# Patient Record
Sex: Male | Born: 1985 | Race: Black or African American | Hispanic: No | Marital: Single | Smoking: Former smoker
Health system: Southern US, Community
[De-identification: ages and names within clinical notes are randomized; demographics above are authoritative.]

## PROBLEM LIST (undated history)

## (undated) DIAGNOSIS — E0590 Thyrotoxicosis, unspecified without thyrotoxic crisis or storm: Secondary | ICD-10-CM

## (undated) DIAGNOSIS — A549 Gonococcal infection, unspecified: Secondary | ICD-10-CM

---

## 2004-07-13 ENCOUNTER — Encounter: Admission: RE | Admit: 2004-07-13 | Discharge: 2004-07-13 | Payer: Self-pay | Admitting: Internal Medicine

## 2007-12-09 ENCOUNTER — Emergency Department (HOSPITAL_COMMUNITY): Admission: EM | Admit: 2007-12-09 | Discharge: 2007-12-09 | Payer: Self-pay | Admitting: Emergency Medicine

## 2011-03-02 LAB — OCCULT BLOOD X 1 CARD TO LAB, STOOL: Fecal Occult Bld: NEGATIVE

## 2011-03-02 LAB — URINALYSIS, ROUTINE W REFLEX MICROSCOPIC
Bilirubin Urine: NEGATIVE
Hgb urine dipstick: NEGATIVE
Specific Gravity, Urine: 1.024
Urobilinogen, UA: 0.2
pH: 7

## 2011-03-02 LAB — DIFFERENTIAL
Basophils Absolute: 0.1
Basophils Relative: 2 — ABNORMAL HIGH
Eosinophils Absolute: 0
Eosinophils Relative: 1
Monocytes Absolute: 0.5

## 2011-03-02 LAB — COMPREHENSIVE METABOLIC PANEL
AST: 28
Albumin: 3.8
Alkaline Phosphatase: 78
CO2: 28
Chloride: 104
Creatinine, Ser: 1.16
GFR calc Af Amer: >60
GFR calc non Af Amer: >60
Potassium: 4.4
Total Bilirubin: 1.1

## 2011-03-02 LAB — CBC
HCT: 42.5
MCV: 88.4
Platelets: 178
RBC: 4.8
WBC: 4.4

## 2011-03-02 LAB — LIPASE, BLOOD: Lipase: 16

## 2019-10-02 ENCOUNTER — Ambulatory Visit: Payer: Self-pay | Attending: Family

## 2019-10-02 DIAGNOSIS — Z23 Encounter for immunization: Secondary | ICD-10-CM

## 2019-10-02 NOTE — Progress Notes (Signed)
   Covid-19 Vaccination Clinic  Name:  Mathew Wong    MRN: [id number] DOB: Oct 13, 1985  10/02/2019  [name] was observed post Covid-19 immunization for 15 minutes without incident. He was provided with Vaccine Information Sheet and instruction to access the V-Safe system.   [name] was instructed to call 911 with any severe reactions post vaccine: Marland Kitchen Difficulty breathing  . Swelling of face and throat  . A fast heartbeat  . A bad rash all over body  . Dizziness and weakness   Immunizations Administered    Name Date Dose VIS Date Route   Moderna COVID-19 Vaccine 10/02/2019  1:31 PM 0.5 mL 05/2019 Intramuscular   Manufacturer: Gala Murdoch   Lot: [id number]   NDC: 30865-784-69      Covid-19 Vaccination Clinic  Name:  Mathew Wong    MRN: [id number] DOB: Aug 11, 1985  10/02/2019  [name] was observed post Covid-19 immunization for 15 minutes without incident. He was provided with Vaccine Information Sheet and instruction to access the V-Safe system.   [name] was instructed to call 911 with any severe reactions post vaccine: Marland Kitchen Difficulty breathing  . Swelling of face and throat  . A fast heartbeat  . A bad rash all over body  . Dizziness and weakness   Immunizations Administered    Name Date Dose VIS Date Route   Moderna COVID-19 Vaccine 10/02/2019  1:31 PM 0.5 mL 05/2019 Intramuscular   Manufacturer: Moderna   Lot: [id number]   NDC: 72536-644-03

## 2019-10-28 ENCOUNTER — Ambulatory Visit: Payer: Self-pay | Attending: Family

## 2019-10-28 DIAGNOSIS — Z23 Encounter for immunization: Secondary | ICD-10-CM

## 2019-10-28 NOTE — Progress Notes (Signed)
   Covid-19 Vaccination Clinic  Name:  Mathew Wong    MRN: [id number] DOB: 1986/05/17  10/28/2019  [name] was observed post Covid-19 immunization for 15 minutes without incident. He was provided with Vaccine Information Sheet and instruction to access the V-Safe system.   [name] was instructed to call 911 with any severe reactions post vaccine: Marland Kitchen Difficulty breathing  . Swelling of face and throat  . A fast heartbeat  . A bad rash all over body  . Dizziness and weakness   Immunizations Administered    Name Date Dose VIS Date Route   Moderna COVID-19 Vaccine 10/28/2019  1:17 PM 0.5 mL 05/2019 Intramuscular   Manufacturer: Moderna   Lot: [id number]   NDC: 24299-806-99

## 2020-06-11 ENCOUNTER — Other Ambulatory Visit: Payer: Self-pay

## 2020-06-11 ENCOUNTER — Encounter (HOSPITAL_COMMUNITY): Payer: Self-pay

## 2020-06-11 DIAGNOSIS — Z5321 Procedure and treatment not carried out due to patient leaving prior to being seen by health care provider: Secondary | ICD-10-CM | POA: Insufficient documentation

## 2020-06-11 DIAGNOSIS — K644 Residual hemorrhoidal skin tags: Secondary | ICD-10-CM | POA: Insufficient documentation

## 2020-06-11 NOTE — ED Triage Notes (Signed)
Pt reports external hemorrhoids off an on for a month.

## 2020-06-12 ENCOUNTER — Emergency Department (HOSPITAL_COMMUNITY)
Admission: EM | Admit: 2020-06-12 | Discharge: 2020-06-12 | Disposition: A | Discharge status: Home / Self Care | Payer: Self-pay | Attending: Emergency Medicine | Admitting: Emergency Medicine

## 2020-06-12 NOTE — ED Notes (Signed)
Pt called 3x for room placement. Eloped from waiting area.  

## 2020-09-10 ENCOUNTER — Other Ambulatory Visit: Payer: Self-pay

## 2020-09-10 ENCOUNTER — Encounter (HOSPITAL_COMMUNITY): Payer: Self-pay | Admitting: Emergency Medicine

## 2020-09-10 ENCOUNTER — Emergency Department (HOSPITAL_COMMUNITY)
Admission: EM | Admit: 2020-09-10 | Discharge: 2020-09-10 | Disposition: A | Discharge status: Home / Self Care | Payer: Self-pay | Attending: Emergency Medicine | Admitting: Emergency Medicine

## 2020-09-10 DIAGNOSIS — Z202 Contact with and (suspected) exposure to infections with a predominantly sexual mode of transmission: Secondary | ICD-10-CM | POA: Insufficient documentation

## 2020-09-10 DIAGNOSIS — R309 Painful micturition, unspecified: Secondary | ICD-10-CM | POA: Insufficient documentation

## 2020-09-10 DIAGNOSIS — Z711 Person with feared health complaint in whom no diagnosis is made: Secondary | ICD-10-CM

## 2020-09-10 DIAGNOSIS — R369 Urethral discharge, unspecified: Secondary | ICD-10-CM | POA: Insufficient documentation

## 2020-09-10 DIAGNOSIS — R300 Dysuria: Secondary | ICD-10-CM

## 2020-09-10 HISTORY — DX: Gonococcal infection, unspecified: A54.9

## 2020-09-10 LAB — URINALYSIS, ROUTINE W REFLEX MICROSCOPIC
Bilirubin Urine: NEGATIVE
Glucose, UA: NEGATIVE mg/dL
Ketones, ur: NEGATIVE mg/dL
Leukocytes,Ua: NEGATIVE
Nitrite: NEGATIVE
Protein, ur: NEGATIVE mg/dL
Specific Gravity, Urine: 1.019 (ref 1.005–1.030)
pH: 5 (ref 5.0–8.0)

## 2020-09-10 LAB — HIV ANTIBODY (ROUTINE TESTING W REFLEX): HIV Screen 4th Generation wRfx: NONREACTIVE

## 2020-09-10 MED ORDER — DOXYCYCLINE HYCLATE 100 MG PO CAPS: 100.0000 mg | ORAL_CAPSULE | Freq: Two times a day (BID) | ORAL | 0 refills | Status: AC

## 2020-09-10 MED ORDER — STERILE WATER FOR INJECTION IJ SOLN
INTRAMUSCULAR | Status: AC
  Filled 2020-09-10: qty 10

## 2020-09-10 MED ORDER — DOXYCYCLINE HYCLATE 100 MG PO TABS
100.0000 mg | ORAL_TABLET | Freq: Once | ORAL | Status: AC
  Administered 2020-09-10: 100 mg via ORAL
  Filled 2020-09-10: qty 1

## 2020-09-10 MED ORDER — CEFTRIAXONE SODIUM 1 G IJ SOLR
500.0000 mg | Freq: Once | INTRAMUSCULAR | Status: AC
  Administered 2020-09-10: 500 mg via INTRAMUSCULAR
  Filled 2020-09-10: qty 10

## 2020-09-10 NOTE — ED Triage Notes (Signed)
Patient reports penile discomfort for the past couple of days that has gotten worse and more constant. Had intercourse recently but used a condom. Denies any open sores or lesions, denies swelling, discharge or concern for HIV or syphilis. Would like to be STD tested.

## 2020-09-10 NOTE — ED Notes (Signed)
Pt discharged from this ED in stable condition at this time. All discharge instructions and follow up care reviewed with pt with no further questions at this time. Pt ambulatory with steady gait, clear speech.  

## 2020-09-10 NOTE — ED Provider Notes (Signed)
[facility] Provider Note   CSN: [id number] Arrival date & time: 09/10/20  1006     History Std check   Mathew Wong is a 35 y.o. male with history significant for gonorrhea who presents for evaluation of penile discomfort.  States he has had some burning with urination.  He denies any rashes or lesions to his penis.  No testicular pain.  No recent sores, lesions, swelling, discharge.  He is sexually active.  Recently had intercourse and used protection.  No fever, chills, nausea, vomiting, chest pain, shortness of breath, testicular pain, pain, rectal bleeding, redness, swelling, warmth to GU region.  No hematuria, flank pain or abdominal pain.  Denies additional aggravating or alleviating factors.  He likely tested for STDs. No pain with BM.  History obtained from patient and past medical records.  No interpreter is used.  HPI     Past Medical History:  Diagnosis Date  . Gonorrhea     There are no problems to display for this patient.   No past surgical history on file.     No family history on file.  Social History   Tobacco Use  . Smoking status: Never Smoker  . Smokeless tobacco: Never Used  Substance Use Topics  . Alcohol use: Not Currently  . Drug use: Not Currently    Home Medications Prior to Admission medications   Medication Sig Start Date End Date Taking? Authorizing Provider  doxycycline (VIBRAMYCIN) 100 MG capsule Take 1 capsule (100 mg total) by mouth 2 (two) times daily for 7 days. 09/10/20 09/17/20 Yes Renlee Floor A, PA-C    Allergies    Patient has no known allergies.  Review of Systems   Review of Systems  Constitutional: Negative.   HENT: Negative.   Respiratory: Negative.   Cardiovascular: Negative.   Gastrointestinal: Negative.   Genitourinary: Negative for decreased urine volume, difficulty urinating, flank pain, frequency, genital sores, hematuria, penile discharge, penile pain, penile  swelling, scrotal swelling, testicular pain and urgency.       Burning with urination  Musculoskeletal: Negative.   Skin: Negative.   Neurological: Negative.   All other systems reviewed and are negative.   Physical Exam Updated Vital Signs BP (!) 151/81   Pulse (!) 101   Temp 98.6 F (37 C) (Oral)   Resp 18   Ht 5\' 8"  (1.727 m)   Wt 73 kg   SpO2 100%   BMI 24.47 kg/m   Physical Exam Vitals and nursing note reviewed. Exam conducted with a chaperone present.  Constitutional:      General: He is not in acute distress.    Appearance: He is well-developed. He is not ill-appearing, toxic-appearing or diaphoretic.  HENT:     Head: Normocephalic and atraumatic.     Nose: Nose normal.     Mouth/Throat:     Mouth: Mucous membranes are moist.  Eyes:     Pupils: Pupils are equal, round, and reactive to light.  Cardiovascular:     Rate and Rhythm: Normal rate and regular rhythm.     Pulses: Normal pulses.     Heart sounds: Normal heart sounds.  Pulmonary:     Effort: Pulmonary effort is normal. No respiratory distress.     Breath sounds: Normal breath sounds.  Abdominal:     General: Bowel sounds are normal. There is no distension.     Palpations: Abdomen is soft.     Tenderness: There is no abdominal tenderness. There  is no right CVA tenderness, left CVA tenderness or guarding.  Genitourinary:    Pubic Area: No rash or pubic lice.      Penis: Normal. No phimosis, paraphimosis, hypospadias, erythema, tenderness, discharge, swelling or lesions.      Testes: Normal. Cremasteric reflex is present.     Epididymis:     Right: Normal.     Left: Normal.     Comments: [place] RN in room as chaperone. No rashes or lesions.  Cremasteric reflex present.  No tenderness over bilateral epididymis.  No inguinal lymphadenopathy or gross hernias Musculoskeletal:        General: Normal range of motion.     Cervical back: Normal range of motion and neck supple.  Skin:    General: Skin is  warm and dry.  Neurological:     Mental Status: He is alert.     ED Results / Procedures / Treatments   Labs (all labs ordered are listed, but only abnormal results are displayed) Labs Reviewed  URINALYSIS, ROUTINE W REFLEX MICROSCOPIC - Abnormal; Notable for the following components:      Result Value   Hgb urine dipstick SMALL (*)    Bacteria, UA RARE (*)    All other components within normal limits  RPR  HIV ANTIBODY (ROUTINE TESTING W REFLEX)  GC/CHLAMYDIA PROBE AMP ([place]) NOT AT [facility]    EKG None  Radiology No results found.  Procedures Procedures   Medications Ordered in ED Medications  sterile water (preservative free) injection (has no administration in time range)  cefTRIAXone (ROCEPHIN) injection 500 mg (500 mg Intramuscular Given 09/10/20 1107)  doxycycline (VIBRA-TABS) tablet 100 mg (100 mg Oral Given 09/10/20 1107)    ED Course  I have reviewed the triage vital signs and the nursing notes.  Pertinent labs & imaging results that were available during my care of the patient were reviewed by me and considered in my medical decision making (see chart for details).   Patient here for evaluation requesting STD testing.  Has had some intermittent burning with urination over the last few days.  No rashes or lesions.  Sexually active, last use a condom.  Cremasteric reflex present.  Nontender bilateral testes and lower epididymis.  No inguinal lymphadenopathy or gross hernias. No pain with BM. Will obtain STD testing.  Patient is afebrile without abdominal tenderness, abdominal pain or painful bowel movements to indicate prostatitis.  No tenderness to palpation of the testes or epididymis to suggest orchitis or epididymitis.  No testicular pain to suggest torsion.  No flank pain to suggest renal stones.  STD cultures obtained including HIV, syphilis, gonorrhea and chlamydia. Patient to be discharged with instructions to follow up with PCP. Discussed importance of  using protection when sexually active. Pt understands that they have GC/Chlamydia cultures pending and that they will need to inform all sexual partners if results return positive. Patient has been treated prophylactically with Doxy and Rocephin.   UA with bacteria, nitrite and leuk negative GC/ Chlamydia, RPR, HIV pending at dc  The patient has been appropriately medically screened and/or stabilized in the ED. I have low suspicion for any other emergent medical condition which would require further screening, evaluation or treatment in the ED or require inpatient management.  Patient is hemodynamically stable and in no acute distress.  Patient able to ambulate in department prior to ED.  Evaluation does not show acute pathology that would require ongoing or additional emergent interventions while in the emergency department or  further inpatient treatment.  I have discussed the diagnosis with the patient and answered all questions.  Pain is been managed while in the emergency department and patient has no further complaints prior to discharge.  Patient is comfortable with plan discussed in room and is stable for discharge at this time.  I have discussed strict return precautions for returning to the emergency department.  Patient was encouraged to follow-up with PCP/specialist refer to at discharge.    MDM Rules/Calculators/A&P                           Final Clinical Impression(s) / ED Diagnoses Final diagnoses:  Burning with urination  Concern about STD in male without diagnosis    Rx / DC Orders ED Discharge Orders         Ordered    doxycycline (VIBRAMYCIN) 100 MG capsule  2 times daily        09/10/20 1123           Mariaclara Spear A, PA-C 09/10/20 1128    Pricilla Loveless, MD 09/10/20 1828

## 2020-09-10 NOTE — Discharge Instructions (Signed)
We obtained blood work as well as STD testing for here in the emergency department.  This takes approximately 2 days to return.  If anything is positive you will be called and notified.  At that time will need to notify your partners.  Given your symptoms to be give you a shot of antibiotics as well as sending you home with antibiotics here for possible STDs.  Return for new or worsening symptoms

## 2020-09-11 LAB — RPR: RPR Ser Ql: NONREACTIVE

## 2020-09-13 LAB — GC/CHLAMYDIA PROBE AMP (~~LOC~~) NOT AT ARMC
Chlamydia: NEGATIVE
Comment: NEGATIVE
Comment: NORMAL
Neisseria Gonorrhea: NEGATIVE

## 2020-10-28 ENCOUNTER — Encounter (HOSPITAL_COMMUNITY): Payer: Self-pay | Admitting: *Deleted

## 2020-10-28 ENCOUNTER — Emergency Department (HOSPITAL_COMMUNITY)
Admission: EM | Admit: 2020-10-28 | Discharge: 2020-10-28 | Disposition: A | Discharge status: Home / Self Care | Payer: Self-pay | Attending: Emergency Medicine | Admitting: Emergency Medicine

## 2020-10-28 DIAGNOSIS — E039 Hypothyroidism, unspecified: Secondary | ICD-10-CM | POA: Insufficient documentation

## 2020-10-28 DIAGNOSIS — R7989 Other specified abnormal findings of blood chemistry: Secondary | ICD-10-CM

## 2020-10-28 LAB — COMPREHENSIVE METABOLIC PANEL
ALT: 36 U/L (ref 0–44)
AST: 22 U/L (ref 15–41)
Albumin: 3.5 g/dL (ref 3.5–5.0)
Alkaline Phosphatase: 152 U/L — ABNORMAL HIGH (ref 38–126)
Anion gap: 7 (ref 5–15)
BUN: 19 mg/dL (ref 6–20)
CO2: 24 mmol/L (ref 22–32)
Calcium: 9.4 mg/dL (ref 8.9–10.3)
Chloride: 105 mmol/L (ref 98–111)
Creatinine, Ser: 0.87 mg/dL (ref 0.61–1.24)
GFR, Estimated: >60 mL/min (ref >60)
Glucose, Bld: 112 mg/dL — ABNORMAL HIGH (ref 70–99)
Potassium: 4.2 mmol/L (ref 3.5–5.1)
Sodium: 136 mmol/L (ref 135–145)
Total Bilirubin: 0.5 mg/dL (ref 0.3–1.2)
Total Protein: 6.7 g/dL (ref 6.5–8.1)

## 2020-10-28 LAB — CBC WITH DIFFERENTIAL/PLATELET
Abs Immature Granulocytes: 0.01 10*3/uL (ref 0.00–0.07)
Basophils Absolute: 0 10*3/uL (ref 0.0–0.1)
Basophils Relative: 1 %
Eosinophils Absolute: 0.2 10*3/uL (ref 0.0–0.5)
Eosinophils Relative: 5 %
HCT: 45.2 % (ref 39.0–52.0)
Hemoglobin: 14.6 g/dL (ref 13.0–17.0)
Immature Granulocytes: 0 %
Lymphocytes Relative: 37 %
Lymphs Abs: 1.3 10*3/uL (ref 0.7–4.0)
MCH: 27 pg (ref 26.0–34.0)
MCHC: 32.3 g/dL (ref 30.0–36.0)
MCV: 83.5 fL (ref 80.0–100.0)
Monocytes Absolute: 0.6 10*3/uL (ref 0.1–1.0)
Monocytes Relative: 17 %
Neutro Abs: 1.5 10*3/uL — ABNORMAL LOW (ref 1.7–7.7)
Neutrophils Relative %: 40 %
Platelets: 224 10*3/uL (ref 150–400)
RBC: 5.41 MIL/uL (ref 4.22–5.81)
RDW: 14.1 % (ref 11.5–15.5)
WBC: 3.6 10*3/uL — ABNORMAL LOW (ref 4.0–10.5)
nRBC: 0 % (ref 0.0–0.2)

## 2020-10-28 LAB — T4, FREE: Free T4: 2.55 ng/dL — ABNORMAL HIGH (ref 0.61–1.12)

## 2020-10-28 LAB — TSH: TSH: <0.01 u[IU]/mL — ABNORMAL LOW (ref 0.350–4.500)

## 2020-10-28 NOTE — ED Triage Notes (Signed)
Pt states his eyes have been draining more than usual and he has lost come weight. He is concerned about his thyroid.

## 2020-10-28 NOTE — Discharge Instructions (Addendum)
As we discussed, your thyroid-stimulating hormone was slightly low here today.  This can sometimes mean that you have hyperthyroidism.  As we discussed, this is something that needs to be addressed by primary care doctor.  I have provided you referral to [facility] wellness clinic. I have also tried a consult to our Transition Care team.  You can also call to primary care clinics in the area to see if they can arrange for an appointment.  This needs to be followed up in the next few weeks.  Rest of your lab work was reassuring.  As we discussed, you had a slightly low white blood cell count but otherwise looked good.  Return to the emergency department if you have any difficulty breathing, vomiting, feeling like your heart is racing or any other worsening concerning symptoms.

## 2020-10-28 NOTE — ED Provider Notes (Signed)
[facility] Note   CSN: [id number] Arrival date & time: 10/28/20  2800     History Chief Complaint  Patient presents with  . Eye Problem    Mathew Wong is a 35 y.o. male who presents for evaluation of concerns of drainage from his eyes.  He states for the last several months, he has noted watery drainage from both eyes.  Sometimes his eyes will be red.  No eye pain, vision changes.  He also reports that since December he has lost about 15 pounds.  He has not followed up with anybody about this.  He states he came in today because he is concerned about his thyroid.  He states no history of thyroid issues but states that has a history of thyroid issues.  He has not had any fevers, chest pain, difficulty breathing, abdominal pain, nausea/vomiting.  No night sweats, fevers.  The history is provided by the patient.       Past Medical History:  Diagnosis Date  . Gonorrhea     There are no problems to display for this patient.   History reviewed. No pertinent surgical history.     No family history on file.  Social History   Tobacco Use  . Smoking status: Never Smoker  . Smokeless tobacco: Never Used  Substance Use Topics  . Alcohol use: Not Currently  . Drug use: Not Currently    Home Medications Prior to Admission medications   Not on File    Allergies    Patient has no known allergies.  Review of Systems   Review of Systems  Constitutional: Positive for unexpected weight change. Negative for diaphoresis and fever.  Eyes: Positive for discharge and redness. Negative for photophobia and visual disturbance.  Respiratory: Negative for cough and shortness of breath.   Cardiovascular: Negative for chest pain.  Gastrointestinal: Negative for abdominal pain, nausea and vomiting.  Genitourinary: Negative for dysuria and hematuria.  Neurological: Negative for headaches.  All other systems reviewed and are  negative.   Physical Exam Updated Vital Signs BP (!) 142/88   Pulse 81   Temp 98.4 F (36.9 C) (Oral)   Resp 18   SpO2 96%   Physical Exam Vitals and nursing note reviewed.  Constitutional:      Appearance: Normal appearance. He is well-developed.  HENT:     Head: Normocephalic and atraumatic.  Eyes:     General: Lids are normal.     Conjunctiva/sclera: Conjunctivae normal.     Pupils: Pupils are equal, round, and reactive to light.     Comments: PERRL. EOMs intact. No nystagmus. No neglect.  No conjunctival injection bilaterally.  No active drainage.  Neck:     Thyroid: No thyroid mass, thyromegaly or thyroid tenderness.     Comments: No thyromegaly noted with palpation of anterior neck. Cardiovascular:     Rate and Rhythm: Normal rate and regular rhythm.     Pulses: Normal pulses.     Heart sounds: Normal heart sounds. No murmur heard. No friction rub. No gallop.   Pulmonary:     Effort: Pulmonary effort is normal.     Breath sounds: Normal breath sounds.     Comments: Lungs clear to auscultation bilaterally.  Symmetric chest rise.  No wheezing, rales, rhonchi. Abdominal:     Palpations: Abdomen is soft. Abdomen is not rigid.     Tenderness: There is no abdominal tenderness. There is no guarding.     Comments:  Abdomen is soft, non-distended, non-tender. No rigidity, No guarding. No peritoneal signs.  Musculoskeletal:        General: Normal range of motion.     Cervical back: Full passive range of motion without pain.  Skin:    General: Skin is warm and dry.     Capillary Refill: Capillary refill takes less than 2 seconds.  Neurological:     Mental Status: He is alert and oriented to person, place, and time.  Psychiatric:        Speech: Speech normal.     ED Results / Procedures / Treatments   Labs (all labs ordered are listed, but only abnormal results are displayed) Labs Reviewed  COMPREHENSIVE METABOLIC PANEL - Abnormal; Notable for the following  components:      Result Value   Glucose, Bld 112 (*)    Alkaline Phosphatase 152 (*)    All other components within normal limits  CBC WITH DIFFERENTIAL/PLATELET - Abnormal; Notable for the following components:   WBC 3.6 (*)    Neutro Abs 1.5 (*)    All other components within normal limits  TSH - Abnormal; Notable for the following components:   TSH <0.010 (*)    All other components within normal limits  T4, FREE    EKG None  Radiology No results found.  Procedures Procedures   Medications Ordered in ED Medications - No data to display  ED Course  I have reviewed the triage vital signs and the nursing notes.  Pertinent labs & imaging results that were available during my care of the patient were reviewed by me and considered in my medical decision making (see chart for details).    MDM Rules/Calculators/A&P                          35 year old male who presents for evaluation of wanting his thyroid checked.  Reports he has had some watery drainage from both eyes over the last several months.  Also reports he has lost about 15 pounds since December 2021.  Reports dad has a history of thyroid issues.  He has never gotten evaluated.  He does not have a primary care doctor that he follows up with.  He denies any chest pain, difficulty breathing, fevers, night sweats.  On initial arrival, he is well-appearing, vital stable.  No acute symptoms at this time.  We will plan to check labs.   CBC shows leukopenia of 3.6.  CMP shows normal BUN/Cr. Glucose is 112.  TSH is low.  Free T4 sent.  I discussed results with patient.  At this time, clinically is well-appearing.  He shows no signs of distress.  No clinical indication of thyroid storm.  I discussed with him that at this time, starting him on a thyroid medication without proper follow-up, signs of acute distress would not be in his best interest.  I stressed the importance of establishing with a primary care doctor.  I provided  him code wellness referral as well as transition of care consult to see if we can help him obtain a PCP.  He is aware that he needs to get this reevaluated soon as possible. Patient had ample opportunity for questions and discussion. All patient's questions were answered with full understanding. Strict return precautions discussed. Patient expresses understanding and agreement to plan.   Portions of this note were generated with Scientist, clinical (histocompatibility and immunogenetics). Dictation errors may occur despite best attempts at proofreading.  Final  Clinical Impression(s) / ED Diagnoses Final diagnoses:  Low TSH level    Rx / DC Orders ED Discharge Orders    None       Maxwell Caul, PA-C 10/28/20 1434    Mancel Bale, MD 10/29/20 [phone]

## 2020-12-08 ENCOUNTER — Other Ambulatory Visit: Payer: Self-pay

## 2020-12-08 ENCOUNTER — Ambulatory Visit: Payer: Self-pay | Attending: Physician Assistant | Admitting: Physician Assistant

## 2020-12-08 ENCOUNTER — Encounter: Payer: Self-pay | Admitting: Physician Assistant

## 2020-12-08 DIAGNOSIS — R634 Abnormal weight loss: Secondary | ICD-10-CM

## 2020-12-08 DIAGNOSIS — R7989 Other specified abnormal findings of blood chemistry: Secondary | ICD-10-CM

## 2020-12-08 DIAGNOSIS — Z09 Encounter for follow-up examination after completed treatment for conditions other than malignant neoplasm: Secondary | ICD-10-CM

## 2020-12-08 NOTE — Progress Notes (Signed)
Patient ID: Mathew Wong, male   DOB: 1986-04-22, 35 y.o.   MRN: [id number] Virtual Visit via Telephone Note  I connected with Mathew Wong on 12/08/20 at  9:10 AM EDT by telephone and verified that I am speaking with the correct person using two identifiers.  Location: Patient: home Provider: [facility] office   I discussed the limitations, risks, security and privacy concerns of performing an evaluation and management service by telephone and the availability of in person appointments. I also discussed with the patient that there may be a patient responsible charge related to this service. The patient expressed understanding and agreed to proceed.   History of Present Illness: After ED visit 10/28/2020 for weight loss, palpitations, watery eyes and wanting to get his thyroid checked.  His dad and maternal gm have/had thyroid dz.  He was found to have abnormal TSH and T4-not started on any meds.  He does not have insurance.    From ED note:  35 year old male who presents for evaluation of wanting his thyroid checked.  Reports he has had some watery drainage from both eyes over the last several months.  Also reports he has lost about 15 pounds since December 2021.  Reports dad has a history of thyroid issues.  He has never gotten evaluated.  He does not have a primary care doctor that he follows up with.  He denies any chest pain, difficulty breathing, fevers, night sweats.  On initial arrival, he is well-appearing, vital stable.  No acute symptoms at this time.  We will plan to check labs.   CBC shows leukopenia of 3.6.  CMP shows normal BUN/Cr. Glucose is 112.  TSH is low.  Free T4 sent.   I discussed results with patient.  At this time, clinically is well-appearing.  He shows no signs of distress.  No clinical indication of thyroid storm.  I discussed with him that at this time, starting him on a thyroid medication without proper follow-up, signs of acute distress would not be in his best  interest.  I stressed the importance of establishing with a primary care doctor.  I provided him code wellness referral as well as transition of care consult to see if we can help him obtain a PCP.  He is aware that he needs to get this reevaluated soon as possible. Patient had ample opportunity for questions and discussion. All patient's questions were answered with full understanding. Strict return precautions discussed. Patient expresses understanding and agreement to plan.    Portions of this note were generated with Scientist, clinical (histocompatibility and immunogenetics). Dictation errors may occur despite best attempts at proofreading.     Observations/Objective:  NAD.  A&Ox3   Assessment and Plan: 1. Abnormal TSH Will start methimazole if still abnormal -obtain financial packet-will refer to endocrine(he wishes to defer for now given no insurance) - Thyroid Panel With TSH; Future  2. Weight loss Other labs overall unremarkable - Thyroid Panel With TSH; Future  3. Hospital discharge follow-up    Follow Up Instructions: Assign PCP in 2 months   I discussed the assessment and treatment plan with the patient. The patient was provided an opportunity to ask questions and all were answered. The patient agreed with the plan and demonstrated an understanding of the instructions.   The patient was advised to call back or seek an in-person evaluation if the symptoms worsen or if the condition fails to improve as anticipated.  I provided 12 minutes of non-face-to-face time during this encounter.  Freeman Caldron, PA-C

## 2020-12-09 LAB — THYROID PANEL WITH TSH
Free Thyroxine Index: 5.4 — ABNORMAL HIGH (ref 1.2–4.9)
T3 Uptake Ratio: 47 % — ABNORMAL HIGH (ref 24–39)
T4, Total: 11.5 ug/dL (ref 4.5–12.0)
TSH: <0.005 u[IU]/mL — ABNORMAL LOW (ref 0.450–4.500)

## 2020-12-10 ENCOUNTER — Other Ambulatory Visit: Payer: Self-pay

## 2020-12-10 ENCOUNTER — Other Ambulatory Visit: Payer: Self-pay | Admitting: Physician Assistant

## 2020-12-10 MED ORDER — METHIMAZOLE 10 MG PO TABS
10.0000 mg | ORAL_TABLET | Freq: Three times a day (TID) | ORAL | 2 refills | Status: DC
  Filled 2020-12-10: qty 90, 30d supply, fill #0
  Filled 2021-01-10: qty 90, 30d supply, fill #1

## 2021-01-08 ENCOUNTER — Emergency Department (HOSPITAL_COMMUNITY)
Admission: EM | Admit: 2021-01-08 | Discharge: 2021-01-09 | Disposition: A | Discharge status: Home / Self Care | Payer: Self-pay | Attending: Emergency Medicine | Admitting: Emergency Medicine

## 2021-01-08 ENCOUNTER — Other Ambulatory Visit: Payer: Self-pay

## 2021-01-08 ENCOUNTER — Encounter (HOSPITAL_COMMUNITY): Payer: Self-pay | Admitting: Emergency Medicine

## 2021-01-08 DIAGNOSIS — R300 Dysuria: Secondary | ICD-10-CM

## 2021-01-08 HISTORY — DX: Thyrotoxicosis, unspecified without thyrotoxic crisis or storm: E05.90

## 2021-01-08 NOTE — ED Triage Notes (Signed)
Pt reports burning with urination and burning in groin area. Denies discharge, fevers, NV. Requesting STD testing.

## 2021-01-09 LAB — URINALYSIS, ROUTINE W REFLEX MICROSCOPIC
Bilirubin Urine: NEGATIVE
Glucose, UA: NEGATIVE mg/dL
Hgb urine dipstick: NEGATIVE
Ketones, ur: NEGATIVE mg/dL
Leukocytes,Ua: NEGATIVE
Nitrite: NEGATIVE
Protein, ur: NEGATIVE mg/dL
Specific Gravity, Urine: 1.023 (ref 1.005–1.030)
pH: 6 (ref 5.0–8.0)

## 2021-01-09 MED ORDER — CEFTRIAXONE SODIUM 1 G IJ SOLR
500.0000 mg | Freq: Once | INTRAMUSCULAR | Status: AC
  Administered 2021-01-09: 500 mg via INTRAMUSCULAR
  Filled 2021-01-09: qty 10

## 2021-01-09 MED ORDER — DOXYCYCLINE HYCLATE 100 MG PO TABS
100.0000 mg | ORAL_TABLET | Freq: Once | ORAL | Status: AC
  Administered 2021-01-09: 100 mg via ORAL
  Filled 2021-01-09: qty 1

## 2021-01-09 MED ORDER — DOXYCYCLINE HYCLATE 100 MG PO CAPS
100.0000 mg | ORAL_CAPSULE | Freq: Two times a day (BID) | ORAL | 0 refills | Status: DC
  Filled 2021-01-09: qty 20, 10d supply, fill #0

## 2021-01-09 MED ORDER — STERILE WATER FOR INJECTION IJ SOLN
INTRAMUSCULAR | Status: AC
  Administered 2021-01-09: 10 mL
  Filled 2021-01-09: qty 10

## 2021-01-09 NOTE — ED Provider Notes (Signed)
[facility]-EMERGENCY DEPT Provider Note   CSN: [id number] Arrival date & time: 01/08/21  2103     History Chief Complaint  Patient presents with   Requesting STD Check    Mathew Wong is a 35 y.o. male.  Patient presents to the emergency department for evaluation of burning with urination.  Patient reports that he has been experiencing dysuria for the last few days.  He is concerned about the possibility of STD.  Patient reports that he had a similar episode in April of this year, was treated with antibiotics in the emergency department but ultimately gonorrhea and Chlamydia tests were negative.  Patient denies any external lesions, swelling, tenderness.  He has not had any urethral discharge.      Past Medical History:  Diagnosis Date   Gonorrhea    Hyperthyroidism     Patient Active Problem List   Diagnosis Date Noted   Abnormal TSH 12/08/2020    History reviewed. No pertinent surgical history.     No family history on file.  Social History   Tobacco Use   Smoking status: Never   Smokeless tobacco: Never  Substance Use Topics   Alcohol use: Not Currently   Drug use: Not Currently    Home Medications Prior to Admission medications   Medication Sig Start Date End Date Taking? Authorizing Provider  doxycycline (VIBRAMYCIN) 100 MG capsule Take 1 capsule (100 mg total) by mouth 2 (two) times daily. 01/09/21  Yes Beverlyn Mcginness, Canary Brim, MD  methimazole (TAPAZOLE) 10 MG tablet Take 1 tablet (10 mg total) by mouth 3 (three) times daily. 12/10/20   Anders Simmonds, PA-C    Allergies    Patient has no known allergies.  Review of Systems   Review of Systems  Genitourinary:  Positive for dysuria.  All other systems reviewed and are negative.  Physical Exam Updated Vital Signs BP (!) 152/97   Pulse 77   Temp 97.9 F (36.6 C) (Oral)   Resp 16   Ht 5\' 8"  (1.727 m)   Wt 74.4 kg   SpO2 100%   BMI 24.94 kg/m   Physical Exam Vitals and  nursing note reviewed.  Constitutional:      General: He is not in acute distress.    Appearance: Normal appearance. He is well-developed.  HENT:     Head: Normocephalic and atraumatic.     Right Ear: Hearing normal.     Left Ear: Hearing normal.     Nose: Nose normal.  Eyes:     Conjunctiva/sclera: Conjunctivae normal.     Pupils: Pupils are equal, round, and reactive to light.  Cardiovascular:     Rate and Rhythm: Regular rhythm.     Heart sounds: S1 normal and S2 normal. No murmur heard.   No friction rub. No gallop.  Pulmonary:     Effort: Pulmonary effort is normal. No respiratory distress.     Breath sounds: Normal breath sounds.  Chest:     Chest wall: No tenderness.  Abdominal:     General: Bowel sounds are normal.     Palpations: Abdomen is soft.     Tenderness: There is no abdominal tenderness. There is no guarding or rebound. Negative signs include Murphy's sign and McBurney's sign.     Hernia: No hernia is present.  Musculoskeletal:        General: Normal range of motion.     Cervical back: Normal range of motion and neck supple.  Skin:  General: Skin is warm and dry.     Findings: No rash.  Neurological:     Mental Status: He is alert and oriented to person, place, and time.     GCS: GCS eye subscore is 4. GCS verbal subscore is 5. GCS motor subscore is 6.     Cranial Nerves: No cranial nerve deficit.     Sensory: No sensory deficit.     Coordination: Coordination normal.  Psychiatric:        Speech: Speech normal.        Behavior: Behavior normal.        Thought Content: Thought content normal.    ED Results / Procedures / Treatments   Labs (all labs ordered are listed, but only abnormal results are displayed) Labs Reviewed  URINALYSIS, ROUTINE W REFLEX MICROSCOPIC - Abnormal; Notable for the following components:      Result Value   APPearance HAZY (*)    All other components within normal limits  URINE CULTURE  GC/CHLAMYDIA PROBE AMP ([facility]) NOT AT [facility]    EKG None  Radiology No results found.  Procedures Procedures   Medications Ordered in ED Medications  cefTRIAXone (ROCEPHIN) injection 500 mg (has no administration in time range)  doxycycline (VIBRA-TABS) tablet 100 mg (has no administration in time range)    ED Course  I have reviewed the triage vital signs and the nursing notes.  Pertinent labs & imaging results that were available during my care of the patient were reviewed by me and considered in my medical decision making (see chart for details).    MDM Rules/Calculators/A&P                           Patient presents to the emergency department for evaluation of dysuria for several days.  Patient does report that he had a similar episode in April that was treated as possible STI and improved.  He has a pain when he urinates and sometimes pain in the rectum.  He has not noticed any urethral discharge or other lesions.  Examination is unremarkable.  Urinalysis does not show any acute abnormality.  Suspect that this may be unrelated to STI, but cannot rule out early prostatitis.  As patient improved last time with empiric treatment, will reinitiate but have follow-up with urology.  Final Clinical Impression(s) / ED Diagnoses Final diagnoses:  Dysuria    Rx / DC Orders ED Discharge Orders          Ordered    doxycycline (VIBRAMYCIN) 100 MG capsule  2 times daily        01/09/21 0056             Gilda Crease, MD 01/09/21 [phone]

## 2021-01-10 ENCOUNTER — Other Ambulatory Visit: Payer: Self-pay

## 2021-01-10 LAB — GC/CHLAMYDIA PROBE AMP (~~LOC~~) NOT AT ARMC
Chlamydia: NEGATIVE
Comment: NEGATIVE
Comment: NORMAL
Neisseria Gonorrhea: NEGATIVE

## 2021-01-10 LAB — URINE CULTURE: Culture: <10000 — AB

## 2021-02-11 ENCOUNTER — Encounter: Payer: Self-pay | Admitting: Nurse Practitioner

## 2021-02-11 ENCOUNTER — Ambulatory Visit: Payer: Self-pay | Attending: Nurse Practitioner | Admitting: Nurse Practitioner

## 2021-02-11 ENCOUNTER — Other Ambulatory Visit: Payer: Self-pay

## 2021-02-11 VITALS — BP 135/82 | HR 74 | Ht 68.0 in | Wt 162.1 lb

## 2021-02-11 DIAGNOSIS — Z7689 Persons encountering health services in other specified circumstances: Secondary | ICD-10-CM

## 2021-02-11 DIAGNOSIS — E0590 Thyrotoxicosis, unspecified without thyrotoxic crisis or storm: Secondary | ICD-10-CM

## 2021-02-11 NOTE — Progress Notes (Signed)
Assessment & Plan:  Mathew Wong was seen today for medication refill.  Diagnoses and all orders for this visit:  Encounter to establish care  Hyperthyroidism -     Thyroid Panel With TSH -     US THYROID; Future   Patient has been counseled on age-appropriate routine health concerns for screening and prevention. These are reviewed and up-to-date. Referrals have been placed accordingly. Immunizations are up-to-date or declined.    Subjective:   Chief Complaint  Patient presents with   Medication Refill   HPI Mathew Wong 35 y.o. male presents to office today to establish with new provider. He has a past medical history of Hyperthyroidism.   Hyperthyroidism: Patient presents with hyperthyroidism.  Symptoms include weight loss, palpitations, .  Symptoms have been present for several months. The patient denies drug abuse, tender neck / sore throat. He is currently taking methimazole 10 mg TID as prescribed.  Prior studies include TSH, antithyroid antibodies.  Family history includes hyperthyroidism in paternal grandmother and father.  Patient has been advised to apply for financial assistance and schedule to see our financial counselor.  He will need to be evaluated by endocrinology and needs thyroid US.      Review of Systems  Constitutional:  Positive for weight loss. Negative for fever and malaise/fatigue.  HENT: Negative.  Negative for nosebleeds.   Eyes: Negative.  Negative for blurred vision, double vision and photophobia.  Respiratory: Negative.  Negative for cough and shortness of breath.   Cardiovascular:  Positive for palpitations. Negative for chest pain and leg swelling.  Gastrointestinal: Negative.  Negative for heartburn, nausea and vomiting.  Musculoskeletal: Negative.  Negative for myalgias.  Neurological: Negative.  Negative for dizziness, focal weakness, seizures and headaches.  Psychiatric/Behavioral: Negative.  Negative for suicidal ideas.    Past Medical  History:  Diagnosis Date   Gonorrhea    Hyperthyroidism     History reviewed. No pertinent surgical history.  History reviewed. No pertinent family history.  Social History Reviewed with no changes to be made today.   Outpatient Medications Prior to Visit  Medication Sig Dispense Refill   methimazole (TAPAZOLE) 10 MG tablet Take 1 tablet (10 mg total) by mouth 3 (three) times daily. 90 tablet 2   doxycycline (VIBRAMYCIN) 100 MG capsule Take 1 capsule (100 mg total) by mouth 2 (two) times daily. (Patient not taking: Reported on 02/11/2021) 20 capsule 0   No facility-administered medications prior to visit.    No Known Allergies     Objective:    BP 135/82   Pulse 74   Ht 5\' 8"  (1.727 m)   Wt 162 lb 2 oz (73.5 kg)   SpO2 96%   BMI 24.65 kg/m  Wt Readings from Last 3 Encounters:  02/11/21 162 lb 2 oz (73.5 kg)  01/08/21 164 lb (74.4 kg)  09/10/20 160 lb 15 oz (73 kg)    Physical Exam Vitals and nursing note reviewed.  Constitutional:      Appearance: He is well-developed.  HENT:     Head: Normocephalic and atraumatic.  Cardiovascular:     Rate and Rhythm: Normal rate and regular rhythm.     Heart sounds: Normal heart sounds. No murmur heard.   No friction rub. No gallop.  Pulmonary:     Effort: Pulmonary effort is normal. No tachypnea or respiratory distress.     Breath sounds: Normal breath sounds. No decreased breath sounds, wheezing, rhonchi or rales.  Chest:     Chest  wall: No tenderness.  Abdominal:     General: Bowel sounds are normal.     Palpations: Abdomen is soft.  Musculoskeletal:        General: Normal range of motion.     Cervical back: Normal range of motion.  Skin:    General: Skin is warm and dry.  Neurological:     Mental Status: He is alert and oriented to person, place, and time.     Coordination: Coordination normal.  Psychiatric:        Behavior: Behavior normal. Behavior is cooperative.        Thought Content: Thought content normal.         Judgment: Judgment normal.         Patient has been counseled extensively about nutrition and exercise as well as the importance of adherence with medications and regular follow-up. The patient was given clear instructions to go to ER or return to medical center if symptoms don't improve, worsen or new problems develop. The patient verbalized understanding.   Follow-up: Return for labs 4 weeks. PHYSICAL in November.   Claiborne Rigg, FNP-BC [facility] [place], [place] [phone]   02/12/2021, 11:50 PM

## 2021-02-12 ENCOUNTER — Encounter: Payer: Self-pay | Admitting: Nurse Practitioner

## 2021-02-12 LAB — THYROID PANEL WITH TSH
Free Thyroxine Index: 0.7 — ABNORMAL LOW (ref 1.2–4.9)
T3 Uptake Ratio: 29 % (ref 24–39)
T4, Total: 2.4 ug/dL — ABNORMAL LOW (ref 4.5–12.0)
TSH: 1.95 u[IU]/mL (ref 0.450–4.500)

## 2021-02-13 MED ORDER — METHIMAZOLE 10 MG PO TABS
20.0000 mg | ORAL_TABLET | Freq: Every day | ORAL | 1 refills | Status: DC
  Filled 2021-02-13 – 2021-08-02 (×3): qty 60, 30d supply, fill #0

## 2021-02-14 ENCOUNTER — Other Ambulatory Visit: Payer: Self-pay

## 2021-02-21 ENCOUNTER — Other Ambulatory Visit: Payer: Self-pay

## 2021-02-25 ENCOUNTER — Other Ambulatory Visit: Payer: Self-pay

## 2021-02-28 ENCOUNTER — Other Ambulatory Visit: Payer: Self-pay

## 2021-03-30 ENCOUNTER — Emergency Department (HOSPITAL_COMMUNITY): Admission: EM | Admit: 2021-03-30 | Discharge: 2021-03-30 | Discharge status: Against Medical Advice | Payer: Self-pay

## 2021-04-01 ENCOUNTER — Other Ambulatory Visit: Payer: Self-pay

## 2021-04-01 ENCOUNTER — Emergency Department (HOSPITAL_COMMUNITY)
Admission: EM | Admit: 2021-04-01 | Discharge: 2021-04-01 | Disposition: A | Discharge status: Home / Self Care | Payer: No Typology Code available for payment source

## 2021-04-01 ENCOUNTER — Ambulatory Visit
Admission: EM | Admit: 2021-04-01 | Discharge: 2021-04-01 | Disposition: A | Discharge status: Home / Self Care | Payer: Self-pay | Attending: Emergency Medicine | Admitting: Emergency Medicine

## 2021-04-01 DIAGNOSIS — M25571 Pain in right ankle and joints of right foot: Secondary | ICD-10-CM

## 2021-04-01 MED ORDER — IBUPROFEN 800 MG PO TABS: 800.0000 mg | ORAL_TABLET | Freq: Three times a day (TID) | ORAL | 0 refills | Status: DC
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
## 2021-04-01 NOTE — Discharge Instructions (Signed)
Report to med Center at [facility] for x-ray of your ankle.  Please pick up your prescription for ibuprofen at your pharmacy.  Thank you for visiting urgent care.  We will notify you of the results of the x-ray once they are received.
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
## 2021-08-02 ENCOUNTER — Other Ambulatory Visit: Payer: Self-pay

## 2021-09-01 ENCOUNTER — Other Ambulatory Visit: Payer: Self-pay | Admitting: Nurse Practitioner

## 2021-09-01 ENCOUNTER — Other Ambulatory Visit: Payer: Self-pay

## 2021-09-02 ENCOUNTER — Other Ambulatory Visit: Payer: Self-pay

## 2021-09-07 ENCOUNTER — Other Ambulatory Visit: Payer: Self-pay

## 2021-09-08 ENCOUNTER — Other Ambulatory Visit: Payer: Self-pay

## 2021-09-08 ENCOUNTER — Other Ambulatory Visit: Payer: Self-pay | Admitting: Nurse Practitioner

## 2021-09-08 MED ORDER — METHIMAZOLE 10 MG PO TABS
20.0000 mg | ORAL_TABLET | Freq: Every day | ORAL | 1 refills | Status: DC
  Filled 2021-09-08: qty 60, 30d supply, fill #0

## 2021-09-09 ENCOUNTER — Other Ambulatory Visit: Payer: Self-pay

## 2021-10-19 ENCOUNTER — Encounter: Payer: Self-pay | Admitting: Nurse Practitioner

## 2021-10-19 ENCOUNTER — Ambulatory Visit (HOSPITAL_COMMUNITY): Admission: RE | Admit: 2021-10-19 | Payer: Self-pay | Source: Ambulatory Visit

## 2021-10-19 ENCOUNTER — Ambulatory Visit: Payer: No Typology Code available for payment source | Attending: Nurse Practitioner | Admitting: Nurse Practitioner

## 2021-10-19 VITALS — BP 140/89 | HR 75 | Temp 98.0°F | Wt 171.0 lb

## 2021-10-19 DIAGNOSIS — E049 Nontoxic goiter, unspecified: Secondary | ICD-10-CM

## 2021-10-19 DIAGNOSIS — Z1159 Encounter for screening for other viral diseases: Secondary | ICD-10-CM

## 2021-10-19 DIAGNOSIS — E0590 Thyrotoxicosis, unspecified without thyrotoxic crisis or storm: Secondary | ICD-10-CM

## 2021-10-19 NOTE — Progress Notes (Signed)
? ?Assessment & Plan:  ?Mathew Wong was seen today for medication refill. ? ?Diagnoses and all orders for this visit: ? ?Hyperthyroidism ?-     Thyroid Panel With TSH ?-     CMP14+EGFR ?-     CBC with Differential ?-     US THYROID; Future ?-     Ambulatory referral to Endocrinology ? ?Need for hepatitis C screening test ?-     HCV Ab w Reflex to Quant PCR ? ?Goiter ?-     US THYROID; Future ? ? ? ?Patient has been counseled on age-appropriate routine health concerns for screening and prevention. These are reviewed and up-to-date. Referrals have been placed accordingly. Immunizations are up-to-date or declined.    ?Subjective:  ? ?Chief Complaint  ?Patient presents with  ? Medication Refill  ? ?HPI ?Mathew Wong 36 y.o. male presents to office today for medication refill. ?He has a past medical history of Gonorrhea and Hyperthyroidism.   ? ? ?Hyperthyroidism ?He has been taking his methimazole as prescribed. Thyroid level is in normal. I have been recommending that he see an endocrinologist for his hyperthyroidism however he has not followed up. States he is uninsured at this time. He has a family history of thyroid disease as well.   ?Lab Results  ?Component Value Date  ? TSH 1.950 02/11/2021  ?  ? ?Review of Systems  ?Constitutional:  Negative for fever, malaise/fatigue and weight loss.  ?HENT: Negative.  Negative for nosebleeds.   ?Eyes: Negative.  Negative for blurred vision, double vision and photophobia.  ?Respiratory: Negative.  Negative for cough and shortness of breath.   ?Cardiovascular: Negative.  Negative for chest pain, palpitations and leg swelling.  ?Gastrointestinal: Negative.  Negative for heartburn, nausea and vomiting.  ?Musculoskeletal: Negative.  Negative for myalgias.  ?Neurological: Negative.  Negative for dizziness, focal weakness, seizures and headaches.  ?Psychiatric/Behavioral: Negative.  Negative for suicidal ideas.   ? ?Past Medical History:  ?Diagnosis Date  ? Gonorrhea   ?  Hyperthyroidism   ? ? ?History reviewed. No pertinent surgical history. ? ?History reviewed. No pertinent family history. ? ?Social History Reviewed with no changes to be made today.  ? ?Outpatient Medications Prior to Visit  ?Medication Sig Dispense Refill  ? methimazole (TAPAZOLE) 10 MG tablet Take 2 tablets (20 mg total) by mouth daily. 60 tablet 1  ? ibuprofen (ADVIL) 800 MG tablet Take 1 tablet (800 mg total) by mouth 3 (three) times daily. (Patient not taking: Reported on 10/19/2021) 21 tablet 0  ? ?No facility-administered medications prior to visit.  ? ? ?No Known Allergies ? ?   ?Objective:  ?  ?BP 140/89   Pulse 75   Temp 98 ?F (36.7 ?C) (Oral)   Wt 171 lb (77.6 kg)   SpO2 100%   BMI 26.00 kg/m?  ?Wt Readings from Last 3 Encounters:  ?10/19/21 171 lb (77.6 kg)  ?02/11/21 162 lb 2 oz (73.5 kg)  ?01/08/21 164 lb (74.4 kg)  ? ? ?Physical Exam ?Vitals and nursing note reviewed.  ?Constitutional:   ?   Appearance: He is well-developed.  ?HENT:  ?   Head: Normocephalic and atraumatic.  ?Neck:  ?   Thyroid: No thyroid mass.  ? ?Cardiovascular:  ?   Rate and Rhythm: Normal rate and regular rhythm.  ?   Heart sounds: Normal heart sounds. No murmur heard. ?  No friction rub. No gallop.  ?Pulmonary:  ?   Effort: Pulmonary effort is normal. No tachypnea or  respiratory distress.  ?   Breath sounds: Normal breath sounds. No decreased breath sounds, wheezing, rhonchi or rales.  ?Chest:  ?   Chest wall: No tenderness.  ?Abdominal:  ?   General: Bowel sounds are normal.  ?   Palpations: Abdomen is soft.  ?Musculoskeletal:     ?   General: Normal range of motion.  ?   Cervical back: Normal range of motion.  ?Skin: ?   General: Skin is warm and dry.  ?Neurological:  ?   Mental Status: He is alert and oriented to person, place, and time.  ?   Coordination: Coordination normal.  ?Psychiatric:     ?   Behavior: Behavior normal. Behavior is cooperative.     ?   Thought Content: Thought content normal.     ?   Judgment:  Judgment normal.  ? ? ? ? ?   ?Patient has been counseled extensively about nutrition and exercise as well as the importance of adherence with medications and regular follow-up. The patient was given clear instructions to go to ER or return to medical center if symptoms don't improve, worsen or new problems develop. The patient verbalized understanding.  ? ?Follow-up: Return in about 3 months (around 01/19/2022).  ? ?Gildardo Pounds, FNP-BC ?[facility] ?[place], [place] ?[phone]   ?10/19/2021, 2:16 PM ?

## 2021-10-19 NOTE — Progress Notes (Signed)
Needs RF on medication ? ?

## 2021-10-20 ENCOUNTER — Telehealth: Payer: Self-pay | Admitting: Nurse Practitioner

## 2021-10-20 LAB — CMP14+EGFR
ALT: 57 IU/L — ABNORMAL HIGH (ref 0–44)
AST: 69 IU/L — ABNORMAL HIGH (ref 0–40)
Albumin/Globulin Ratio: 1.8 (ref 1.2–2.2)
Albumin: 4.6 g/dL (ref 4.0–5.0)
Alkaline Phosphatase: 174 IU/L — ABNORMAL HIGH (ref 44–121)
BUN/Creatinine Ratio: 6 — ABNORMAL LOW (ref 9–20)
BUN: 10 mg/dL (ref 6–20)
Bilirubin Total: 0.2 mg/dL (ref 0.0–1.2)
CO2: 24 mmol/L (ref 20–29)
Calcium: 9.4 mg/dL (ref 8.7–10.2)
Chloride: 98 mmol/L (ref 96–106)
Creatinine, Ser: 1.64 mg/dL — ABNORMAL HIGH (ref 0.76–1.27)
Globulin, Total: 2.6 g/dL (ref 1.5–4.5)
Glucose: 93 mg/dL (ref 70–99)
Potassium: 4.2 mmol/L (ref 3.5–5.2)
Sodium: 138 mmol/L (ref 134–144)
Total Protein: 7.2 g/dL (ref 6.0–8.5)
eGFR: 55 mL/min/{1.73_m2} — ABNORMAL LOW (ref >59)

## 2021-10-20 LAB — HCV INTERPRETATION

## 2021-10-20 LAB — THYROID PANEL WITH TSH
T3 Uptake Ratio: 18 % — ABNORMAL LOW (ref 24–39)
T4, Total: <0.4 ug/dL — CL (ref 4.5–12.0)
TSH: 55.7 u[IU]/mL — ABNORMAL HIGH (ref 0.450–4.500)

## 2021-10-20 LAB — CBC WITH DIFFERENTIAL/PLATELET
Basophils Absolute: 0.1 10*3/uL (ref 0.0–0.2)
Basos: 1 %
EOS (ABSOLUTE): 0 10*3/uL (ref 0.0–0.4)
Eos: 1 %
Hematocrit: 43.1 % (ref 37.5–51.0)
Hemoglobin: 14 g/dL (ref 13.0–17.7)
Immature Grans (Abs): 0 10*3/uL (ref 0.0–0.1)
Immature Granulocytes: 0 %
Lymphocytes Absolute: 1.3 10*3/uL (ref 0.7–3.1)
Lymphs: 31 %
MCH: 27.7 pg (ref 26.6–33.0)
MCHC: 32.5 g/dL (ref 31.5–35.7)
MCV: 85 fL (ref 79–97)
Monocytes Absolute: 0.3 10*3/uL (ref 0.1–0.9)
Monocytes: 8 %
Neutrophils Absolute: 2.6 10*3/uL (ref 1.4–7.0)
Neutrophils: 59 %
Platelets: 232 10*3/uL (ref 150–450)
RBC: 5.05 x10E6/uL (ref 4.14–5.80)
RDW: 15.2 % (ref 11.6–15.4)
WBC: 4.3 10*3/uL (ref 3.4–10.8)

## 2021-10-20 LAB — HCV AB W REFLEX TO QUANT PCR: HCV Ab: NONREACTIVE

## 2021-10-20 NOTE — Telephone Encounter (Signed)
Copied from CRM [id number]. Topic: General - Other >> Oct 20, 2021  8:34 AM Wyonia Hough E wrote: Reason for CRM: Pt would like a call to over lab results

## 2021-10-22 ENCOUNTER — Other Ambulatory Visit: Payer: Self-pay | Admitting: Nurse Practitioner

## 2021-10-22 MED ORDER — METHIMAZOLE 10 MG PO TABS
10.0000 mg | ORAL_TABLET | Freq: Every day | ORAL | 1 refills | Status: DC
  Filled 2021-10-22: qty 30, 30d supply, fill #0

## 2021-10-24 ENCOUNTER — Other Ambulatory Visit: Payer: Self-pay

## 2021-10-26 ENCOUNTER — Ambulatory Visit (HOSPITAL_COMMUNITY)
Admission: RE | Admit: 2021-10-26 | Discharge: 2021-10-26 | Disposition: A | Discharge status: Home / Self Care | Payer: Self-pay | Source: Ambulatory Visit | Attending: Nurse Practitioner | Admitting: Nurse Practitioner

## 2021-10-26 DIAGNOSIS — E049 Nontoxic goiter, unspecified: Secondary | ICD-10-CM | POA: Insufficient documentation

## 2021-10-26 DIAGNOSIS — E0590 Thyrotoxicosis, unspecified without thyrotoxic crisis or storm: Secondary | ICD-10-CM | POA: Insufficient documentation

## 2021-10-28 ENCOUNTER — Telehealth: Payer: Self-pay | Admitting: Nurse Practitioner

## 2021-10-28 NOTE — Telephone Encounter (Signed)
Copied from [place]. Topic: General - Other >> Oct 28, 2021  8:42 AM Valere Dross wrote: Reason for CRM: Pt called in stating the referral he has for endocrinology cant get him in until Dec/Jan, and needs a new referral, pt requested if he could speak with PCP, please advise. >> Oct 28, 2021  8:47 AM Valere Dross wrote: Pt also scheduled a appt that he was advised to come in a few weeks. Pt was requesting if he could get squeezed in sometime in June, please advise.

## 2021-11-01 ENCOUNTER — Other Ambulatory Visit: Payer: Self-pay

## 2021-11-01 NOTE — Telephone Encounter (Signed)
Mathew Wong, please schedule apt or what may be available as patient requested.    Thanks.

## 2021-11-08 NOTE — Telephone Encounter (Signed)
Attempt to call patient to inform of appt.  Unable to reach. No voicemail set up.

## 2021-11-11 ENCOUNTER — Other Ambulatory Visit: Payer: Self-pay | Admitting: Pharmacist

## 2021-11-11 MED ORDER — METHIMAZOLE 10 MG PO TABS: 10.0000 mg | ORAL_TABLET | Freq: Every day | ORAL | 0 refills | Status: DC

## 2021-11-22 ENCOUNTER — Encounter: Payer: Self-pay | Admitting: *Deleted

## 2021-11-22 NOTE — Telephone Encounter (Addendum)
Unable to reach patient, several attempts made to reach out to patient over a period of time. No voicemail set up.    See in appointment where paperwork was mailed to patient from Endo's office.

## 2021-12-19 ENCOUNTER — Ambulatory Visit (INDEPENDENT_AMBULATORY_CARE_PROVIDER_SITE_OTHER): Payer: Self-pay | Admitting: "Endocrinology

## 2021-12-19 ENCOUNTER — Encounter: Payer: Self-pay | Admitting: "Endocrinology

## 2021-12-19 VITALS — BP 134/78 | HR 68 | Ht 68.0 in | Wt 171.6 lb

## 2021-12-19 DIAGNOSIS — E032 Hypothyroidism due to medicaments and other exogenous substances: Secondary | ICD-10-CM

## 2021-12-19 DIAGNOSIS — E049 Nontoxic goiter, unspecified: Secondary | ICD-10-CM

## 2021-12-19 NOTE — Progress Notes (Signed)
Endocrinology Consult Note                                            12/19/2021, 12:47 PM   Subjective:    Patient ID: Mathew Wong, male    DOB: 1985-12-11, PCP Mathew Pounds, NP   Past Medical History:  Diagnosis Date   Gonorrhea    Hyperthyroidism    History reviewed. No pertinent surgical history. Social History   Socioeconomic History   Marital status: Single    Spouse name: Not on file   Number of children: Not on file   Years of education: Not on file   Highest education level: Not on file  Occupational History   Not on file  Tobacco Use   Smoking status: Former    Types: Cigarettes   Smokeless tobacco: Never  Vaping Use   Vaping Use: Every day  Substance and Sexual Activity   Alcohol use: Not Currently   Drug use: Not Currently   Sexual activity: Not on file  Other Topics Concern   Not on file  Social History Narrative   Not on file   Social Determinants of Health   Financial Resource Strain: Not on file  Food Insecurity: Not on file  Transportation Needs: Not on file  Physical Activity: Not on file  Stress: Not on file  Social Connections: Not on file   Family History  Problem Relation Age of Onset   Thyroid disease Mathew Wong    Diabetes Mathew Wong    Outpatient Encounter Medications as of 12/19/2021  Medication Sig   [DISCONTINUED] methimazole (TAPAZOLE) 10 MG tablet Take 1 tablet (10 mg total) by mouth daily.   No facility-administered encounter medications on file as of 12/19/2021.   ALLERGIES: No Known Allergies  VACCINATION STATUS: Immunization History  Administered Date(s) Administered   Moderna Sars-Covid-2 Vaccination 10/02/2019, 10/28/2019    HPI Mathew Wong is 36 y.o. male who presents today with a medical history as above. he is being seen in consultation for hyperthyroidism requested by Mathew Pounds, NP.  History is obtained directly from the patient as well as chart review.  He reports that he was diagnosed  with hyperthyroidism approximately a year and a half ago.  He is being treated with methimazole, currently 10 mg p.o. daily.  He has taken this medication for at least a year.  His most recent thyroid function tests are consistent with iatrogenic hypothyroidism.  He denies palpitations, tremors, heat intolerance.  He presents with steady weight compared to weight loss when he was hypothyroidism.  He also has goiter with no discrete nodules.  He denies dysphagia, shortness of breath, nor voice change.  He has unidentified thyroid dysfunctions in several family members.  He denies family history of thyroid malignancy.      Review of Systems  Constitutional: no recent weight gain/loss, no fatigue, no subjective hyperthermia, no subjective hypothermia Eyes: no blurry vision, no xerophthalmia ENT: no sore throat, no nodules palpated in throat, no dysphagia/odynophagia, no hoarseness Cardiovascular: no Chest Pain, no Shortness of Breath, no palpitations, no leg swelling Respiratory: no cough, no shortness of breath Gastrointestinal: no Nausea/Vomiting/Diarhhea Musculoskeletal: no muscle/joint aches Skin: no rashes Neurological: no tremors, no numbness, no tingling, no dizziness Psychiatric: no depression, no anxiety  Objective:       12/19/2021    9:11  AM 10/19/2021   11:51 AM 10/19/2021   10:45 AM  Vitals with BMI  Height _0     Weight 171 lbs 10 oz  171 lbs  BMI 76.8    Systolic 115 726 203  Diastolic 78 89 83  Pulse 68  75    BP 134/78   Pulse 68   Ht _1  (1.727 m)   Wt 171 lb 9.6 oz (77.8 kg)   BMI 26.09 kg/m   Wt Readings from Last 3 Encounters:  12/19/21 171 lb 9.6 oz (77.8 kg)  10/19/21 171 lb (77.6 kg)  02/11/21 162 lb 2 oz (73.5 kg)    Physical Exam  Constitutional:  Body mass index is 26.09 kg/m.,  not in acute distress, normal state of mind Eyes: PERRLA, EOMI, no exophthalmos ENT: moist mucous membranes, + gross thyromegaly, no gross cervical  lymphadenopathy Cardiovascular: normal precordial activity, Regular Rate and Rhythm, no Murmur/Rubs/Gallops Respiratory:  adequate breathing efforts, no gross chest deformity, Clear to auscultation bilaterally Gastrointestinal: abdomen soft, Non -tender, No distension, Bowel Sounds present, no gross organomegaly Musculoskeletal: no gross deformities, strength intact in all four extremities Skin: moist, warm, no rashes Neurological: no tremor with outstretched hands, Deep tendon reflexes normal in bilateral lower extremities.  CMP ( most recent) CMP     Component Value Date/Time   NA 138 10/19/2021 1202   K 4.2 10/19/2021 1202   CL 98 10/19/2021 1202   CO2 24 10/19/2021 1202   GLUCOSE 93 10/19/2021 1202   GLUCOSE 112 (H) 10/28/2020 1024   BUN 10 10/19/2021 1202   CREATININE 1.64 (H) 10/19/2021 1202   CALCIUM 9.4 10/19/2021 1202   PROT 7.2 10/19/2021 1202   ALBUMIN 4.6 10/19/2021 1202   AST 69 (H) 10/19/2021 1202   ALT 57 (H) 10/19/2021 1202   ALKPHOS 174 (H) 10/19/2021 1202   BILITOT 0.2 10/19/2021 1202   GFRNONAA >60 10/28/2020 1024   GFRAA  12/09/2007 1530    >60        The eGFR has been calculated using the MDRD equation. This calculation has not been validated in all clinical   Lab Results  Component Value Date   TSH 55.700 (H) 10/19/2021   TSH 1.950 02/11/2021   TSH <0.005 (L) 12/08/2020   TSH <0.010 (L) 10/28/2020   FREET4 2.55 (H) 10/28/2020     Thyroid ultrasound on Oct 26, 2021  His right lobe measured 5.8 cm, left lobe measures 5.5 cm.   No discrete nodules are seen within the thyroid gland.   No regional cervical lymphadenopathy.   IMPRESSION: Enlarged, markedly heterogeneous and diffusely hyperemic thyroid without discrete nodule or mass, findings are nonspecific though suggestive of an acute thyroiditis. Clinical correlation is advised.    Assessment & Plan:   1. Iatrogenic hypothyroidism  2.  Mathew Wong  is being seen at a  kind request of Mathew Pounds, NP. - I have reviewed his available thyroid records and clinically evaluated the patient. - Based on these reviews, he has iatrogenic hypothyroidism. He is advised to discontinue methimazole for now.  He will have repeat thyroid function test in 9 weeks with office visit.  If he returns with a relapse of hyperthyroidism, he will be considered for thyroid uptake and scan in preparation for I-131 thyroid ablation.  His recent thyroid ultrasound is consistent with simple goiter with no discrete nodules.  - I did not initiate any new prescriptions today. - he is advised to  maintain close follow up with Mathew Pounds, NP for primary care needs.   - Time spent with the patient: 50 minutes, of which >50% was spent in  counseling him about his goiter, iatrogenic hypothyroidism, history of hyperthyroidism and the rest in obtaining information about his symptoms, reviewing his previous labs/studies ( including abstractions from other facilities),  evaluations, and treatments,  and developing a plan to confirm diagnosis and long term treatment based on the latest standards of care/guidelines; and documenting his care.  Mathew Wong participated in the discussions, expressed understanding, and voiced agreement with the above plans.  All questions were answered to his satisfaction. he is encouraged to contact clinic should he have any questions or concerns prior to his return visit.  Follow up plan: Return in about 9 weeks (around 02/20/2022) for F/U with Pre-visit Labs.   Glade Lloyd, MD [facility] [address] [place], [place] [address] Phone: [phone]  Fax: [phone]     12/19/2021, 12:47 PM  This note was partially dictated with voice recognition software. Similar sounding words can be transcribed inadequately or may not  be corrected upon review.

## 2022-01-27 ENCOUNTER — Encounter: Payer: Self-pay | Admitting: "Endocrinology

## 2022-01-27 ENCOUNTER — Ambulatory Visit: Payer: Medicaid Other | Attending: Nurse Practitioner | Admitting: Nurse Practitioner

## 2022-01-27 ENCOUNTER — Encounter: Payer: Self-pay | Admitting: Nurse Practitioner

## 2022-01-27 VITALS — BP 153/85 | HR 99 | Temp 98.3°F | Ht 68.0 in | Wt 157.0 lb

## 2022-01-27 DIAGNOSIS — E0580 Other thyrotoxicosis without thyrotoxic crisis or storm: Secondary | ICD-10-CM

## 2022-01-27 NOTE — Progress Notes (Addendum)
Assessment & Plan:  Mathew Wong was seen today for thyroid problem.  Diagnoses and all orders for this visit:  Iatrogenic hyperthyroidism -     TSH -     T3, Free -     T4, Free -     Thyroid Peroxidase Antibody -     Thyroglobulin antibody    Patient has been counseled on age-appropriate routine health concerns for screening and prevention. These are reviewed and up-to-date. Referrals have been placed accordingly. Immunizations are up-to-date or declined.    Subjective:   Chief Complaint  Patient presents with   Thyroid Problem    Weight concern, taken off of methimozole last month and has lost 13 pounds    Thyroid Problem Symptoms include weight loss. Patient reports no palpitations.   Mathew Wong 36 y.o. male presents to office today with concerns of almost 15 lbweight loss over the past 30days. He was recently taken off his methimazole by Endocrinology and diagnosed with iatrogenic hyperthyroidism. He does not have an appt for follow up until September and is concerned about his thyroid levels. I have encouraged him to message his endocrinologist regarding his concerns however since he is here today we will go ahead and proceed with obtaining thyroid levels. Blood pressure is also elevated beyond normal today.  BP Readings from Last 3 Encounters:  01/27/22 (!) 153/85  12/19/21 134/78  10/19/21 140/89     Review of Systems  Constitutional:  Positive for weight loss. Negative for fever and malaise/fatigue.  HENT: Negative.  Negative for nosebleeds.   Eyes: Negative.  Negative for blurred vision, double vision and photophobia.  Respiratory: Negative.  Negative for cough and shortness of breath.   Cardiovascular: Negative.  Negative for chest pain, palpitations and leg swelling.  Gastrointestinal: Negative.  Negative for heartburn, nausea and vomiting.  Musculoskeletal: Negative.  Negative for myalgias.  Neurological: Negative.  Negative for dizziness, focal weakness,  seizures and headaches.  Psychiatric/Behavioral: Negative.  Negative for suicidal ideas.     Past Medical History:  Diagnosis Date   Gonorrhea    Hyperthyroidism     History reviewed. No pertinent surgical history.  Family History  Problem Relation Age of Onset   Thyroid disease Father    Diabetes Father     Social History Reviewed with no changes to be made today.   No outpatient medications prior to visit.   No facility-administered medications prior to visit.    No Known Allergies     Objective:    BP (!) 153/85 (BP Location: Right Arm, Patient Position: Sitting, Cuff Size: Normal)   Pulse 99   Temp 98.3 F (36.8 C)   Ht 5\' 8"  (1.727 m)   Wt 157 lb (71.2 kg)   SpO2 100%   BMI 23.87 kg/m  Wt Readings from Last 3 Encounters:  01/27/22 157 lb (71.2 kg)  12/19/21 171 lb 9.6 oz (77.8 kg)  10/19/21 171 lb (77.6 kg)    Physical Exam Vitals and nursing note reviewed.  Constitutional:      Appearance: He is well-developed.  HENT:     Head: Normocephalic and atraumatic.  Cardiovascular:     Rate and Rhythm: Normal rate and regular rhythm.     Heart sounds: Normal heart sounds. No murmur heard.    No friction rub. No gallop.  Pulmonary:     Effort: Pulmonary effort is normal. No tachypnea or respiratory distress.     Breath sounds: Normal breath sounds. No decreased breath sounds,  wheezing, rhonchi or rales.  Chest:     Chest wall: No tenderness.  Abdominal:     General: Bowel sounds are normal.     Palpations: Abdomen is soft.  Musculoskeletal:        General: Normal range of motion.     Cervical back: Normal range of motion.  Skin:    General: Skin is warm and dry.  Neurological:     Mental Status: He is alert and oriented to person, place, and time.     Coordination: Coordination normal.  Psychiatric:        Behavior: Behavior normal. Behavior is cooperative.        Thought Content: Thought content normal.        Judgment: Judgment normal.           Patient has been counseled extensively about nutrition and exercise as well as the importance of adherence with medications and regular follow-up. The patient was given clear instructions to go to ER or return to medical center if symptoms don't improve, worsen or new problems develop. The patient verbalized understanding.   Follow-up: Return if symptoms worsen or fail to improve, for Follow up with DR. Fransico Him.   Claiborne Rigg, FNP-BC [facility] [place], [place] [phone]   01/27/2022, 2:06 PM

## 2022-01-29 LAB — THYROGLOBULIN ANTIBODY: Thyroglobulin Antibody: 181.7 IU/mL — ABNORMAL HIGH (ref 0.0–0.9)

## 2022-01-29 LAB — THYROID PEROXIDASE ANTIBODY: Thyroperoxidase Ab SerPl-aCnc: >600 IU/mL — ABNORMAL HIGH (ref 0–34)

## 2022-01-29 LAB — T3, FREE: T3, Free: 18.7 pg/mL — ABNORMAL HIGH (ref 2.0–4.4)

## 2022-01-29 LAB — T4, FREE: Free T4: 4.52 ng/dL — ABNORMAL HIGH (ref 0.82–1.77)

## 2022-01-29 LAB — TSH: TSH: 0.011 u[IU]/mL — ABNORMAL LOW (ref 0.450–4.500)

## 2022-02-09 ENCOUNTER — Encounter: Payer: Self-pay | Admitting: "Endocrinology

## 2022-02-09 ENCOUNTER — Ambulatory Visit: Payer: Medicaid Other | Admitting: "Endocrinology

## 2022-02-09 ENCOUNTER — Ambulatory Visit (INDEPENDENT_AMBULATORY_CARE_PROVIDER_SITE_OTHER): Payer: Self-pay | Admitting: "Endocrinology

## 2022-02-09 VITALS — BP 136/64 | HR 96 | Ht 68.0 in | Wt 156.0 lb

## 2022-02-09 DIAGNOSIS — E0590 Thyrotoxicosis, unspecified without thyrotoxic crisis or storm: Secondary | ICD-10-CM | POA: Insufficient documentation

## 2022-02-09 DIAGNOSIS — E049 Nontoxic goiter, unspecified: Secondary | ICD-10-CM

## 2022-02-09 MED ORDER — METHIMAZOLE 10 MG PO TABS: 10.0000 mg | ORAL_TABLET | Freq: Every day | ORAL | 2 refills | Status: DC

## 2022-02-09 MED ORDER — PREDNISONE 5 MG PO TBEC: 1.0000 | DELAYED_RELEASE_TABLET | Freq: Every day | ORAL | 0 refills | Status: DC

## 2022-02-09 NOTE — Progress Notes (Signed)
02/09/2022, 2:31 PM  Endocrinology follow-up note   Subjective:    Patient ID: Mathew Wong, male    DOB: Mar 23, 1986, PCP Gildardo Pounds, NP   Past Medical History:  Diagnosis Date   Gonorrhea    Hyperthyroidism    History reviewed. No pertinent surgical history. Social History   Socioeconomic History   Marital status: Single    Spouse name: Not on file   Number of children: Not on file   Years of education: Not on file   Highest education level: Not on file  Occupational History   Not on file  Tobacco Use   Smoking status: Former    Types: Cigarettes   Smokeless tobacco: Never  Vaping Use   Vaping Use: Every day  Substance and Sexual Activity   Alcohol use: Not Currently   Drug use: Not Currently   Sexual activity: Not on file  Other Topics Concern   Not on file  Social History Narrative   Not on file   Social Determinants of Health   Financial Resource Strain: Not on file  Food Insecurity: Not on file  Transportation Needs: Not on file  Physical Activity: Not on file  Stress: Not on file  Social Connections: Not on file   Family History  Problem Relation Age of Onset   Thyroid disease Father    Diabetes Father    Outpatient Encounter Medications as of 02/09/2022  Medication Sig   methimazole (TAPAZOLE) 10 MG tablet Take 1 tablet (10 mg total) by mouth daily with breakfast.   predniSONE 5 MG TBEC Take 1 tablet by mouth daily with breakfast.   No facility-administered encounter medications on file as of 02/09/2022.   ALLERGIES: No Known Allergies  VACCINATION STATUS: Immunization History  Administered Date(s) Administered   Moderna Sars-Covid-2 Vaccination 10/02/2019, 10/28/2019    HPI Mathew Wong is 36 y.o. male who presents today with a medical history as above. he is being seen in follow-up after he was seen in consultation for hyperthyroidism requested by Gildardo Pounds, NP.   See notes from prior visit.   -He reports that he was diagnosed with hyperthyroidism approximately a year and a half ago.  During his last visit, while on methimazole 10 mg p.o. 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  He was advised to hold his methimazole until repeat labs.    His repeat labs are consistent with relapse of hyperthyroidism with symptoms including weight loss, anxiety, tremors.    He also has goiter with no discrete nodules.  He denies dysphagia, shortness of breath, nor voice change.  He has unidentified thyroid dysfunctions in several family members.  He denies family history of thyroid malignancy.      Review of Systems  Constitutional: no recent weight gain/loss, no fatigue, no subjective hyperthermia, no subjective hypothermia Eyes: no blurry vision, no xerophthalmia ENT: no sore throat, no nodules palpated in throat, no dysphagia/odynophagia, no hoarseness Cardiovascular: no Chest Pain, no Shortness of Breath, no palpitations, no leg swelling Respiratory: no cough, no shortness of breath Gastrointestinal: no Nausea/Vomiting/Diarhhea Musculoskeletal: no muscle/joint aches Skin: no rashes Neurological: no tremors, no numbness, no tingling, no dizziness Psychiatric: no depression, + anxiety  Objective:       02/09/2022   10:50 AM 01/27/2022    1:47 PM 12/19/2021    9:11 AM  Vitals with BMI  Height '5\' 8"'  '5\' 8"'  '5\' 8"'   Weight 156 lbs 157 lbs 171 lbs 10 oz  BMI 23.73 67.59 16.3  Systolic 846 659 935  Diastolic 64 85 78  Pulse 96 99 68    BP 136/64   Pulse 96   Ht '5\' 8"'  (1.727 m)   Wt 156 lb (70.8 kg)   BMI 23.72 kg/m   Wt Readings from Last 3 Encounters:  02/09/22 156 lb (70.8 kg)  01/27/22 157 lb (71.2 kg)  12/19/21 171 lb 9.6 oz (77.8 kg)    Physical Exam  Constitutional:  Body mass index is 23.72 kg/m.,  not in acute distress, normal state of mind Eyes: PERRLA, EOMI, no exophthalmos ENT: moist mucous membranes, + gross  thyromegaly with bruit, no gross cervical lymphadenopathy Cardiovascular: normal precordial activity, Regular Rate and Rhythm, no Murmur/Rubs/Gallops Respiratory:  adequate breathing efforts, no gross chest deformity, Clear to auscultation bilaterally Gastrointestinal: abdomen soft, Non -tender, No distension, Bowel Sounds present, no gross organomegaly Musculoskeletal: no gross deformities, strength intact in all four extremities Skin: moist, warm, no rashes Neurological: no tremor with outstretched hands, Deep tendon reflexes normal in bilateral lower extremities.  CMP ( most recent) CMP     Component Value Date/Time   NA 138 10/19/2021 1202   K 4.2 10/19/2021 1202   CL 98 10/19/2021 1202   CO2 24 10/19/2021 1202   GLUCOSE 93 10/19/2021 1202   GLUCOSE 112 (H) 10/28/2020 1024   BUN 10 10/19/2021 1202   CREATININE 1.64 (H) 10/19/2021 1202   CALCIUM 9.4 10/19/2021 1202   PROT 7.2 10/19/2021 1202   ALBUMIN 4.6 10/19/2021 1202   AST 69 (H) 10/19/2021 1202   ALT 57 (H) 10/19/2021 1202   ALKPHOS 174 (H) 10/19/2021 1202   BILITOT 0.2 10/19/2021 1202   GFRNONAA >60 10/28/2020 1024   GFRAA  12/09/2007 1530    >60        The eGFR has been calculated using the MDRD equation. This calculation has not been validated in all clinical   Lab Results  Component Value Date   TSH 0.011 (L) 01/27/2022   TSH 55.700 (H) 10/19/2021   TSH 1.950 02/11/2021   TSH <0.005 (L) 12/08/2020   TSH <0.010 (L) 10/28/2020   FREET4 4.52 (H) 01/27/2022   FREET4 2.55 (H) 10/28/2020     Thyroid ultrasound on Oct 26, 2021  His right lobe measured 5.8 cm, left lobe measures 5.5 cm.   No discrete nodules are seen within the thyroid gland.   No regional cervical lymphadenopathy.   IMPRESSION: Enlarged, markedly heterogeneous and diffusely hyperemic thyroid without discrete nodule or mass, findings are nonspecific though suggestive of an acute thyroiditis. Clinical correlation is  advised.    Assessment & Plan:   1.  Hyperthyroidism 2.  Goiter  I reviewed his recent labs and reevaluated the patient.  He returns with relapse of hyperthyroidism with clinical symptoms.  His labs showed indication of possible Graves' disease. Eventually, he will benefit from I-131 thyroid ablation.  However, due to his thyroid hormone burden, he will be treated towards target with methimazole. I discussed and reinitiated methimazole 10 mg p.o. daily at breakfast, prednisone 5 mg p.o. daily at breakfast for the next 15 days. We will have repeat labs in 3 weeks and office visit in 4 weeks  After his thyroid is reasonably controlled,  he will be considered for thyroid uptake and scan in preparation for I-131 thyroid ablation.  His recent thyroid ultrasound is consistent with simple goiter with no discrete nodules.  - he is advised to maintain close follow up with Gildardo Pounds, NP for primary care needs.  I spent 32 minutes in the care of the patient today including review of labs from Thyroid Function, CMP, and other relevant labs ; imaging/biopsy records (current and previous including abstractions from other facilities); face-to-face time discussing  his lab results and symptoms, medications doses, his options of short and long term treatment based on the latest standards of care / guidelines;   and documenting the encounter.  Mathew Wong  participated in the discussions, expressed understanding, and voiced agreement with the above plans.  All questions were answered to his satisfaction. he is encouraged to contact clinic should he have any questions or concerns prior to his return visit.   Follow up plan: Return in about 4 weeks (around 03/09/2022) for F/U with Pre-visit Labs.   Glade Lloyd, MD [facility] [address] [place], [place] [address] Phone: [phone]  Fax: [phone]     02/09/2022, 2:31 PM  This note  was partially dictated with voice recognition software. Similar sounding words can be transcribed inadequately or may not  be corrected upon review.

## 2022-02-20 ENCOUNTER — Other Ambulatory Visit: Payer: Self-pay | Admitting: "Endocrinology

## 2022-02-20 ENCOUNTER — Ambulatory Visit: Payer: Medicaid Other | Admitting: "Endocrinology

## 2022-03-09 ENCOUNTER — Ambulatory Visit: Payer: Medicaid Other | Admitting: "Endocrinology

## 2022-03-10 LAB — TSH: TSH: 0.055 u[IU]/mL — ABNORMAL LOW (ref 0.450–4.500)

## 2022-03-10 LAB — COMPREHENSIVE METABOLIC PANEL
ALT: 116 IU/L — ABNORMAL HIGH (ref 0–44)
AST: 324 IU/L — ABNORMAL HIGH (ref 0–40)
Albumin/Globulin Ratio: 1.5 (ref 1.2–2.2)
Albumin: 4.1 g/dL (ref 4.1–5.1)
Alkaline Phosphatase: 126 IU/L — ABNORMAL HIGH (ref 44–121)
BUN/Creatinine Ratio: 12 (ref 9–20)
BUN: 14 mg/dL (ref 6–20)
Bilirubin Total: 0.3 mg/dL (ref 0.0–1.2)
CO2: 25 mmol/L (ref 20–29)
Calcium: 9.4 mg/dL (ref 8.7–10.2)
Chloride: 104 mmol/L (ref 96–106)
Creatinine, Ser: 1.17 mg/dL (ref 0.76–1.27)
Globulin, Total: 2.8 g/dL (ref 1.5–4.5)
Glucose: 83 mg/dL (ref 70–99)
Potassium: 4.4 mmol/L (ref 3.5–5.2)
Sodium: 142 mmol/L (ref 134–144)
Total Protein: 6.9 g/dL (ref 6.0–8.5)
eGFR: 82 mL/min/{1.73_m2} (ref >59)

## 2022-03-10 LAB — T3, FREE: T3, Free: 2.9 pg/mL (ref 2.0–4.4)

## 2022-03-10 LAB — T4, FREE: Free T4: 0.43 ng/dL — ABNORMAL LOW (ref 0.82–1.77)

## 2022-03-11 ENCOUNTER — Encounter: Payer: Self-pay | Admitting: "Endocrinology

## 2022-04-06 ENCOUNTER — Telehealth: Payer: Self-pay | Admitting: "Endocrinology

## 2022-04-06 ENCOUNTER — Ambulatory Visit (INDEPENDENT_AMBULATORY_CARE_PROVIDER_SITE_OTHER): Payer: Self-pay | Admitting: "Endocrinology

## 2022-04-06 ENCOUNTER — Encounter: Payer: Self-pay | Admitting: "Endocrinology

## 2022-04-06 VITALS — BP 132/84 | HR 84 | Ht 68.0 in | Wt 159.0 lb

## 2022-04-06 DIAGNOSIS — E0590 Thyrotoxicosis, unspecified without thyrotoxic crisis or storm: Secondary | ICD-10-CM

## 2022-04-06 NOTE — Progress Notes (Signed)
04/06/2022, 7:37 PM  Endocrinology follow-up note   Subjective:    Patient ID: Mathew Wong, male    DOB: 02-14-1986, PCP Mathew Pounds, NP   Past Medical History:  Diagnosis Date   Gonorrhea    Hyperthyroidism    History reviewed. No pertinent surgical history. Social History   Socioeconomic History   Marital status: Single    Spouse name: Not on file   Number of children: Not on file   Years of education: Not on file   Highest education level: Not on file  Occupational History   Not on file  Tobacco Use   Smoking status: Former    Types: Cigarettes   Smokeless tobacco: Never  Vaping Use   Vaping Use: Every day  Substance and Sexual Activity   Alcohol use: Not Currently   Drug use: Not Currently   Sexual activity: Not on file  Other Topics Concern   Not on file  Social History Narrative   Not on file   Social Determinants of Health   Financial Resource Strain: Not on file  Food Insecurity: Not on file  Transportation Needs: Not on file  Physical Activity: Not on file  Stress: Not on file  Social Connections: Not on file   Family History  Problem Relation Age of Onset   Thyroid disease Father    Diabetes Father    Outpatient Encounter Medications as of 04/06/2022  Medication Sig   methimazole (TAPAZOLE) 5 MG tablet Take 5 mg by mouth daily.   QUEtiapine (SEROQUEL) 50 MG tablet Take by mouth daily.   [DISCONTINUED] methimazole (TAPAZOLE) 10 MG tablet Take 1 tablet (10 mg total) by mouth daily with breakfast.   [DISCONTINUED] predniSONE (DELTASONE) 5 MG tablet TAKE 1 TABLET BY MOUTH EVERY DAY WITH BREAKFAST   [DISCONTINUED] predniSONE 5 MG TBEC Take 1 tablet by mouth daily with breakfast.   No facility-administered encounter medications on file as of 04/06/2022.   ALLERGIES: No Known Allergies  VACCINATION STATUS: Immunization History  Administered Date(s) Administered   Moderna  Sars-Covid-2 Vaccination 10/02/2019, 10/28/2019    HPI Mathew Wong is 36 y.o. male who presents today with a medical history as above. he is being seen in follow-up after he was seen in consultation for hyperthyroidism requested by Mathew Pounds, NP.  See notes from prior visit.   -He reports that he was diagnosed with hyperthyroidism approximately a year and a half ago.  During his last visit, while on methimazole 10 mg p.o. 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  When he was taken off of methimazole, he developed relapse of parathyroidism.  He was initiated on low-dose methimazole 5 mg p.o. daily to his last visit.   He returns with an evidence of treatment response, however patient has been changing the dose of his methimazole.  He also appeared to have transaminitis which has since cleared.  He also has goiter with no discrete nodules.  He denies dysphagia, shortness of breath, nor voice change.  He has unidentified thyroid dysfunctions in several family members.  He denies family history of thyroid malignancy.      Review of Systems  Constitutional: no recent weight gain/loss, no  fatigue, no subjective hyperthermia, no subjective hypothermia Eyes: no blurry vision, no xerophthalmia ENT: no sore throat, no nodules palpated in throat, no dysphagia/odynophagia, no hoarseness   Objective:       04/06/2022   11:43 AM 02/09/2022   10:50 AM 01/27/2022    1:47 PM  Vitals with BMI  Height _0  _1  _2   Weight 159 lbs 156 lbs 157 lbs  BMI 24.18 86.57 84.69  Systolic 629 528 413  Diastolic 84 64 85  Pulse 84 96 99    BP 132/84   Pulse 84   Ht _3  (1.727 m)   Wt 159 lb (72.1 kg)   BMI 24.18 kg/m   Wt Readings from Last 3 Encounters:  04/06/22 159 lb (72.1 kg)  02/09/22 156 lb (70.8 kg)  01/27/22 157 lb (71.2 kg)    Physical Exam  Constitutional:  Body mass index is 24.18 kg/m.,  not in acute distress, normal state of mind Eyes: PERRLA,  EOMI, no exophthalmos ENT: moist mucous membranes, + gross thyromegaly with bruit, no gross cervical lymphadenopathy   CMP ( most recent) CMP     Component Value Date/Time   NA 142 03/09/2022 1203   K 4.4 03/09/2022 1203   CL 104 03/09/2022 1203   CO2 25 03/09/2022 1203   GLUCOSE 83 03/09/2022 1203   GLUCOSE 112 (H) 10/28/2020 1024   BUN 14 03/09/2022 1203   CREATININE 1.17 03/09/2022 1203   CALCIUM 9.4 03/09/2022 1203   PROT 6.9 03/09/2022 1203   ALBUMIN 4.1 03/09/2022 1203   AST 324 (H) 03/09/2022 1203   ALT 116 (H) 03/09/2022 1203   ALKPHOS 126 (H) 03/09/2022 1203   BILITOT 0.3 03/09/2022 1203   GFRNONAA >60 10/28/2020 1024   GFRAA  12/09/2007 1530    >60        The eGFR has been calculated using the MDRD equation. This calculation has not been validated in all clinical   Lab Results  Component Value Date   TSH 0.055 (L) 03/09/2022   TSH 0.011 (L) 01/27/2022   TSH 55.700 (H) 10/19/2021   TSH 1.950 02/11/2021   TSH <0.005 (L) 12/08/2020   TSH <0.010 (L) 10/28/2020   FREET4 0.43 (L) 03/09/2022   FREET4 4.52 (H) 01/27/2022   FREET4 2.55 (H) 10/28/2020     Thyroid ultrasound on Oct 26, 2021  His right lobe measured 5.8 cm, left lobe measures 5.5 cm.   No discrete nodules are seen within the thyroid gland.   No regional cervical lymphadenopathy.   IMPRESSION: Enlarged, markedly heterogeneous and diffusely hyperemic thyroid without discrete nodule or mass, findings are nonspecific though suggestive of an acute thyroiditis. Clinical correlation is advised.    Assessment & Plan:   1.  Hyperthyroidism 2.  Goiter  I reviewed his recent labs and reevaluated the patient.  He returns with minimal effect with methimazole.  This patient will need definitive intervention with radioactive iodine.  However, he will need thyroid uptake and scan prior to that intervention.  His previous labs were suggestive of Graves' disease.  I discussed and ordered thyroid uptake and  scan to be done after holding his methimazole for 5 days prior to the scan.    His recent thyroid ultrasound is consistent with simple goiter with no discrete nodules.  - he is advised to maintain close follow up with Mathew Pounds, NP for primary care needs.   I spent 21 minutes in the care of the patient today  including review of labs from Thyroid Function, CMP, and other relevant labs ; imaging/biopsy records (current and previous including abstractions from other facilities); face-to-face time discussing  his lab results and symptoms, medications doses, his options of short and long term treatment based on the latest standards of care / guidelines;   and documenting the encounter.  Mathew Wong  participated in the discussions, expressed understanding, and voiced agreement with the above plans.  All questions were answered to his satisfaction. he is encouraged to contact clinic should he have any questions or concerns prior to his return visit.    Follow up plan: Return in about 2 weeks (around 04/20/2022) for F/U with Thyroid Uptake and Scan.   Glade Lloyd, MD [facility] [address] [place], [place] [address] Phone: [phone]  Fax: [phone]     04/06/2022, 7:37 PM  This note was partially dictated with voice recognition software. Similar sounding words can be transcribed inadequately or may not  be corrected upon review.

## 2022-04-06 NOTE — Telephone Encounter (Signed)
Pt is requesting a call from Dr Dorris Fetch. He would not give [place] any information about what he needed.  [phone]

## 2022-04-19 ENCOUNTER — Encounter (HOSPITAL_COMMUNITY): Payer: Self-pay

## 2022-04-19 ENCOUNTER — Encounter (HOSPITAL_COMMUNITY)
Admission: RE | Admit: 2022-04-19 | Discharge: 2022-04-19 | Disposition: A | Discharge status: Home / Self Care | Payer: Self-pay | Source: Ambulatory Visit | Attending: "Endocrinology | Admitting: "Endocrinology

## 2022-04-19 DIAGNOSIS — E0590 Thyrotoxicosis, unspecified without thyrotoxic crisis or storm: Secondary | ICD-10-CM | POA: Insufficient documentation

## 2022-04-19 MED ORDER — SODIUM IODIDE I-123 7.4 MBQ CAPS
500.0000 | ORAL_CAPSULE | Freq: Once | ORAL | Status: AC
  Administered 2022-04-19: 436 via ORAL

## 2022-04-20 ENCOUNTER — Encounter (HOSPITAL_COMMUNITY)
Admission: RE | Admit: 2022-04-20 | Discharge: 2022-04-20 | Disposition: A | Discharge status: Home / Self Care | Payer: Self-pay | Source: Ambulatory Visit | Attending: "Endocrinology | Admitting: "Endocrinology

## 2022-04-24 ENCOUNTER — Encounter: Payer: Self-pay | Admitting: "Endocrinology

## 2022-04-24 ENCOUNTER — Ambulatory Visit (INDEPENDENT_AMBULATORY_CARE_PROVIDER_SITE_OTHER): Payer: Self-pay | Admitting: "Endocrinology

## 2022-04-24 VITALS — BP 136/78 | HR 96 | Ht 68.0 in | Wt 160.0 lb

## 2022-04-24 DIAGNOSIS — E0500 Thyrotoxicosis with diffuse goiter without thyrotoxic crisis or storm: Secondary | ICD-10-CM | POA: Insufficient documentation

## 2022-04-24 DIAGNOSIS — E0590 Thyrotoxicosis, unspecified without thyrotoxic crisis or storm: Secondary | ICD-10-CM

## 2022-04-24 MED ORDER — PROPRANOLOL HCL 20 MG PO TABS: 20.0000 mg | ORAL_TABLET | Freq: Two times a day (BID) | ORAL | 0 refills | Status: DC

## 2022-04-24 NOTE — Progress Notes (Signed)
04/24/2022, 5:08 PM  Endocrinology follow-up note   Subjective:    Patient ID: Mathew Wong, male    DOB: 08-15-1985, PCP Gildardo Pounds, NP   Past Medical History:  Diagnosis Date   Gonorrhea    Hyperthyroidism    History reviewed. No pertinent surgical history. Social History   Socioeconomic History   Marital status: Single    Spouse name: Not on file   Number of children: Not on file   Years of education: Not on file   Highest education level: Not on file  Occupational History   Not on file  Tobacco Use   Smoking status: Former    Types: Cigarettes   Smokeless tobacco: Never  Vaping Use   Vaping Use: Every day  Substance and Sexual Activity   Alcohol use: Not Currently   Drug use: Not Currently   Sexual activity: Not on file  Other Topics Concern   Not on file  Social History Narrative   Not on file   Social Determinants of Health   Financial Resource Strain: Not on file  Food Insecurity: Not on file  Transportation Needs: Not on file  Physical Activity: Not on file  Stress: Not on file  Social Connections: Not on file   Family History  Problem Relation Age of Onset   Thyroid disease Father    Diabetes Father    Outpatient Encounter Medications as of 04/24/2022  Medication Sig   propranolol (INDERAL) 20 MG tablet Take 1 tablet (20 mg total) by mouth 2 (two) times daily.   methimazole (TAPAZOLE) 5 MG tablet Take 5 mg by mouth daily. (Patient not taking: Reported on 04/24/2022)   QUEtiapine (SEROQUEL) 50 MG tablet Take by mouth daily. (Patient not taking: Reported on 04/24/2022)   No facility-administered encounter medications on file as of 04/24/2022.   ALLERGIES: No Known Allergies  VACCINATION STATUS: Immunization History  Administered Date(s) Administered   Moderna Sars-Covid-2 Vaccination 10/02/2019, 10/28/2019    HPI Mathew Wong is 36 y.o. male who presents today with a  medical history as above. he is being seen in follow-up after he was seen in consultation for hyperthyroidism requested by Gildardo Pounds, NP.  See notes from prior visit.   -He reports that he was diagnosed with hyperthyroidism approximately a year and a half ago.  During his last visit, while on methimazole 10 mg p.o. 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  When he was taken off of methimazole, he developed relapse of parathyroidism.  He was initiated on low-dose methimazole 5 mg p.o. daily to his last visit.   He recently returned with evidence of treatment failure with methimazole.  He is previsit thyroid uptake and scan confirms primary hyperthyroidism with Graves' disease.   He also had transaminitis secondary to methimazole which has since resolved.   He also has goiter with no discrete nodules.  He denies dysphagia, shortness of breath, nor voice change.  He has unidentified thyroid dysfunctions in several family members.  He denies family history of thyroid malignancy.      Review of Systems  Constitutional: no recent weight gain/loss, no fatigue, no subjective hyperthermia, no subjective hypothermia Eyes: no blurry vision,  no xerophthalmia ENT: no sore throat, no nodules palpated in throat, no dysphagia/odynophagia, no hoarseness   Objective:       04/24/2022   11:27 AM 04/06/2022   11:43 AM 02/09/2022   10:50 AM  Vitals with BMI  Height _0  _1  _2   Weight 160 lbs 159 lbs 156 lbs  BMI 24.33 47.42 59.56  Systolic 387 564 332  Diastolic 78 84 64  Pulse 96 84 96    BP 136/78   Pulse 96   Ht _3  (1.727 m)   Wt 160 lb (72.6 kg)   BMI 24.33 kg/m   Wt Readings from Last 3 Encounters:  04/24/22 160 lb (72.6 kg)  04/06/22 159 lb (72.1 kg)  02/09/22 156 lb (70.8 kg)    Physical Exam  Constitutional:  Body mass index is 24.33 kg/m.,  not in acute distress, normal state of mind Eyes: PERRLA, EOMI, no exophthalmos ENT: moist mucous  membranes, + gross thyromegaly with bruit, no gross cervical lymphadenopathy   CMP ( most recent) CMP     Component Value Date/Time   NA 142 03/09/2022 1203   K 4.4 03/09/2022 1203   CL 104 03/09/2022 1203   CO2 25 03/09/2022 1203   GLUCOSE 83 03/09/2022 1203   GLUCOSE 112 (H) 10/28/2020 1024   BUN 14 03/09/2022 1203   CREATININE 1.17 03/09/2022 1203   CALCIUM 9.4 03/09/2022 1203   PROT 6.9 03/09/2022 1203   ALBUMIN 4.1 03/09/2022 1203   AST 324 (H) 03/09/2022 1203   ALT 116 (H) 03/09/2022 1203   ALKPHOS 126 (H) 03/09/2022 1203   BILITOT 0.3 03/09/2022 1203   GFRNONAA >60 10/28/2020 1024   GFRAA  12/09/2007 1530    >60        The eGFR has been calculated using the MDRD equation. This calculation has not been validated in all clinical   Lab Results  Component Value Date   TSH 0.055 (L) 03/09/2022   TSH 0.011 (L) 01/27/2022   TSH 55.700 (H) 10/19/2021   TSH 1.950 02/11/2021   TSH <0.005 (L) 12/08/2020   TSH <0.010 (L) 10/28/2020   FREET4 0.43 (L) 03/09/2022   FREET4 4.52 (H) 01/27/2022   FREET4 2.55 (H) 10/28/2020     Thyroid ultrasound on Oct 26, 2021  His right lobe measured 5.8 cm, left lobe measures 5.5 cm.   No discrete nodules are seen within the thyroid gland.   No regional cervical lymphadenopathy.   IMPRESSION: Enlarged, markedly heterogeneous and diffusely hyperemic thyroid without discrete nodule or mass, findings are nonspecific though suggestive of an acute thyroiditis. Clinical correlation is advised.   Thyroid uptake and scan on April 20, 2022 FINDINGS: Homogeneous increased tracer uptake within mildly enlarged thyroid lobes bilaterally.   No focal areas of increased or decreased tracer localization.   4 hour I-123 uptake = 49.6% (normal 5-20%),  24 hour I-123 uptake = 39.9% (normal 10-30%)   IMPRESSION: Homogeneous tracer distribution in mildly enlarged BILATERAL thyroid lobes with increased 4 hour and 24 hour radio iodine uptakes.    Findings consistent with Graves disease.  Assessment & Plan:   1.  Hyperthyroidism 2.  Goiter  I reviewed his thyroid uptake and scan results with him.  Options of treatment were discussed with him.  Given his prior transaminitis from methimazole as well as treatment failure, he is offered his next option of treatment.  I discussed and ordered radioactive iodine thyroid ablation.  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   He agrees with this plan.  This treatment will be administered in [facility] in the next several days.  In the meantime, he would benefit from low-dose beta-blocker.  I discussed and ordered propanolol 20 mg p.o. twice daily for the next 30 days. His recent thyroid ultrasound is consistent with simple goiter with no discrete nodules.  - he is advised to maintain close follow up with Gildardo Pounds, NP for primary care needs.   I spent 23 minutes in the care of the patient today including review of labs from Thyroid Function, CMP, and other relevant labs ; imaging/biopsy records (current and previous including abstractions from other facilities); face-to-face time discussing  his lab results and symptoms, medications doses, his options of short and long term treatment based on the latest standards of care / guidelines;   and documenting the encounter.  Mathew Wong  participated in the discussions, expressed understanding, and voiced agreement with the above plans.  All questions were answered to his satisfaction. he is encouraged to contact clinic should he have any questions or concerns prior to his return visit.    Follow up plan: Return in about 8 weeks (around 06/19/2022) for F/U with Labs after I131 Therapy.   Glade Lloyd, MD [facility] [address] [place], [place] [address] Phone: [phone]  Fax:  [phone]     04/24/2022, 5:08 PM  This note was partially dictated with voice recognition software. Similar sounding words can be transcribed inadequately or may not  be corrected upon review.

## 2022-04-26 NOTE — Written Directive (Addendum)
MOLECULAR IMAGING AND THERAPEUTICS WRITTEN DIRECTIVE   PATIENT NAME: Mathew Wong  PT DOB:   08-16-1985                                              MRN: [id number]  ---------------------------------------------------------------------------------------------------------------------   I-131 WHOLE THYROID THERAPY (NON-CANCER)    RADIOPHARMACEUTICAL:   Iodine-131 Capsule    PRESCRIBED DOSE FOR ADMINISTRATION:   20 mCi  (twenty milliCuries)   ROUTE OFADMINISTRATION: PO   DIAGNOSIS:  Graves Disease   REFERRING PHYSICIAN: Dr. Fransico Him   TSH:    Lab Results  Component Value Date   TSH 0.055 (L) 03/09/2022   TSH 0.011 (L) 01/27/2022   TSH 55.700 (H) 10/19/2021     PRIOR I-131 THERAPY (Date and Dose): N/A   PRIOR RADIOLOGY EXAMS (Results and Date): NM THYROID MULT UPTAKE W/IMAGING  Result Date: 04/20/2022 CLINICAL DATA:  Hyperthyroidism EXAM: THYROID SCAN AND UPTAKE - 4 AND 24 HOURS TECHNIQUE: Following oral administration of I-123 capsule, anterior planar imaging was acquired at 24 hours. Thyroid uptake was calculated with a thyroid probe at 4-6 hours and 24 hours. RADIOPHARMACEUTICALS:  436 uCi I-123 sodium iodide p.o. COMPARISON:  Thyroid ultrasound 10/26/2021 FINDINGS: Homogeneous increased tracer uptake within mildly enlarged thyroid lobes bilaterally. No focal areas of increased or decreased tracer localization. 4 hour I-123 uptake = 49.6% (normal 5-20%) 24 hour I-123 uptake = 39.9% (normal 10-30%) IMPRESSION: Homogeneous tracer distribution in mildly enlarged BILATERAL thyroid lobes with increased 4 hour and 24 hour radio iodine uptakes. Findings consistent with Graves disease. Electronically Signed   By: Ulyses Southward M.D.   On: 04/20/2022 12:54   US THYROID  Result Date: 10/26/2021 CLINICAL DATA:  Hyperthyroid. EXAM: THYROID ULTRASOUND TECHNIQUE: Ultrasound examination of the thyroid gland and adjacent soft tissues was performed. COMPARISON:  None Available. FINDINGS:  Parenchymal Echotexture: Markedly heterogenous diffuse glandular hyperemia (image 3, 7, 29 and 32) Isthmus: Enlarged measuring 1.4 cm in diameter Right lobe: Enlarged measuring 5.8 x 4.2 x 2.6 cm Left lobe: Enlarged measuring 5.5 x 3.9 x 2.4 cm _________________________________________________________ Estimated total number of nodules >/= 1 cm: 0 Number of spongiform nodules >/=  2 cm not described below (TR1): 0 Number of mixed cystic and solid nodules >/= 1.5 cm not described below (TR2): 0 _________________________________________________________ No discrete nodules are seen within the thyroid gland. No regional cervical lymphadenopathy. IMPRESSION: Enlarged, markedly heterogeneous and diffusely hyperemic thyroid without discrete nodule or mass, findings are nonspecific though suggestive of an acute thyroiditis. Clinical correlation is advised. Electronically Signed   By: Simonne Come M.D.   On: 10/26/2021 16:44      ADDITIONAL PHYSICIAN COMMENTS/NOTES   AUTHORIZED USER SIGNATURE & TIME STAMP:

## 2022-05-05 ENCOUNTER — Encounter (HOSPITAL_COMMUNITY)
Admission: RE | Admit: 2022-05-05 | Discharge: 2022-05-05 | Disposition: A | Discharge status: Home / Self Care | Payer: Medicaid Other | Source: Ambulatory Visit | Attending: "Endocrinology | Admitting: "Endocrinology

## 2022-05-05 DIAGNOSIS — E0590 Thyrotoxicosis, unspecified without thyrotoxic crisis or storm: Secondary | ICD-10-CM | POA: Diagnosis not present

## 2022-05-05 MED ORDER — SODIUM IODIDE I 131 CAPSULE
20.0000 | Freq: Once | INTRAVENOUS | Status: AC | PRN
  Administered 2022-05-05: 19.7 via ORAL

## 2022-05-21 ENCOUNTER — Other Ambulatory Visit: Payer: Self-pay | Admitting: "Endocrinology

## 2022-06-14 LAB — T3, FREE: T3, Free: 3.3 pg/mL (ref 2.0–4.4)

## 2022-06-14 LAB — T4, FREE: Free T4: 1.14 ng/dL (ref 0.82–1.77)

## 2022-06-14 LAB — TSH: TSH: <0.005 u[IU]/mL — ABNORMAL LOW (ref 0.450–4.500)

## 2022-06-20 ENCOUNTER — Encounter: Payer: Self-pay | Admitting: "Endocrinology

## 2022-06-20 ENCOUNTER — Ambulatory Visit (INDEPENDENT_AMBULATORY_CARE_PROVIDER_SITE_OTHER): Payer: Medicaid Other | Admitting: "Endocrinology

## 2022-06-20 VITALS — BP 124/72 | HR 68 | Ht 68.0 in | Wt 175.0 lb

## 2022-06-20 DIAGNOSIS — E0500 Thyrotoxicosis with diffuse goiter without thyrotoxic crisis or storm: Secondary | ICD-10-CM | POA: Diagnosis not present

## 2022-06-20 DIAGNOSIS — E0590 Thyrotoxicosis, unspecified without thyrotoxic crisis or storm: Secondary | ICD-10-CM | POA: Diagnosis not present

## 2022-06-20 NOTE — Progress Notes (Signed)
06/20/2022, 11:08 AM  Endocrinology follow-up note   Subjective:    Patient ID: Mathew Wong, male    DOB: [birth year], PCP Gildardo Pounds, NP   Past Medical History:  Diagnosis Date   Gonorrhea    Hyperthyroidism    History reviewed. No pertinent surgical history. Social History   Socioeconomic History   Marital status: Single    Spouse name: Not on file   Number of children: Not on file   Years of education: Not on file   Highest education level: Not on file  Occupational History   Not on file  Tobacco Use   Smoking status: Former    Types: Cigarettes   Smokeless tobacco: Never  Vaping Use   Vaping Use: Every day  Substance and Sexual Activity   Alcohol use: Not Currently   Drug use: Not Currently   Sexual activity: Not on file  Other Topics Concern   Not on file  Social History Narrative   Not on file   Social Determinants of Health   Financial Resource Strain: Not on file  Food Insecurity: Not on file  Transportation Needs: Not on file  Physical Activity: Not on file  Stress: Not on file  Social Connections: Not on file   Family History  Problem Relation Age of Onset   Thyroid disease Father    Diabetes Father    Outpatient Encounter Medications as of 06/20/2022  Medication Sig   [DISCONTINUED] methimazole (TAPAZOLE) 5 MG tablet Take 5 mg by mouth daily. (Patient not taking: Reported on 04/24/2022)   [DISCONTINUED] propranolol (INDERAL) 20 MG tablet Take 1 tablet (20 mg total) by mouth daily with breakfast.   [DISCONTINUED] QUEtiapine (SEROQUEL) 50 MG tablet Take by mouth daily. (Patient not taking: Reported on 04/24/2022)   No facility-administered encounter medications on file as of 06/20/2022.   ALLERGIES: No Known Allergies  VACCINATION STATUS: Immunization History  Administered Date(s) Administered   Moderna Sars-Covid-2 Vaccination 10/02/2019, 10/28/2019    HPI Mathew Wong  is 37 y.o. male who presents today with a medical history as above. he is being seen in follow-up after he received radioactive iodine thyroid ablation on May 05, 2022.  He was diagnosed with hyperthyroidism from Graves' disease prior to treatment.   He feels better, regained the weight he lost when he was thyrotoxic.  He is still on low-dose propranolol.  Prior to RAI, he did have treatment failure with methimazole.  His previsit thyroid function tests are consistent with treatment effect, without hypothyroidism.  He also has goiter with no discrete nodules.  He denies dysphagia, shortness of breath, nor voice change.  He has unidentified thyroid dysfunctions in several family members.  He denies family history of thyroid malignancy.      Review of Systems  Constitutional: no recent weight gain/loss, no fatigue, no subjective hyperthermia, no subjective hypothermia Eyes: no blurry vision, no xerophthalmia ENT: no sore throat, no nodules palpated in throat, no dysphagia/odynophagia, no hoarseness   Objective:       06/20/2022   10:29 AM 04/24/2022   11:27 AM 04/06/2022   11:43 AM  Vitals with BMI  Height 5\' 8"  5\' 8"  5\' 8"   Weight 175 lbs  160 lbs 159 lbs  BMI 26.61 24.33 24.18  Systolic 124 136 630  Diastolic 72 78 84  Pulse 68 96 84    BP 124/72   Pulse 68   Ht 5\' 8"  (1.727 m)   Wt 175 lb (79.4 kg)   BMI 26.61 kg/m   Wt Readings from Last 3 Encounters:  06/20/22 175 lb (79.4 kg)  04/24/22 160 lb (72.6 kg)  04/06/22 159 lb (72.1 kg)    Physical Exam  Constitutional:  Body mass index is 26.61 kg/m.,  not in acute distress, normal state of mind Eyes: PERRLA, EOMI, no exophthalmos ENT: moist mucous membranes, + gross thyromegaly with bruit, no gross cervical lymphadenopathy   CMP ( most recent) CMP     Component Value Date/Time   NA 142 03/09/2022 1203   K 4.4 03/09/2022 1203   CL 104 03/09/2022 1203   CO2 25 03/09/2022 1203   GLUCOSE 83 03/09/2022 1203    GLUCOSE 112 (H) 10/28/2020 1024   BUN 14 03/09/2022 1203   CREATININE 1.17 03/09/2022 1203   CALCIUM 9.4 03/09/2022 1203   PROT 6.9 03/09/2022 1203   ALBUMIN 4.1 03/09/2022 1203   AST 324 (H) 03/09/2022 1203   ALT 116 (H) 03/09/2022 1203   ALKPHOS 126 (H) 03/09/2022 1203   BILITOT 0.3 03/09/2022 1203   GFRNONAA >60 10/28/2020 1024   GFRAA  12/09/2007 1530    >60        The eGFR has been calculated using the MDRD equation. This calculation has not been validated in all clinical   Lab Results  Component Value Date   TSH <0.005 (L) 06/13/2022   TSH 0.055 (L) 03/09/2022   TSH 0.011 (L) 01/27/2022   TSH 55.700 (H) 10/19/2021   TSH 1.950 02/11/2021   TSH <0.005 (L) 12/08/2020   TSH <0.010 (L) 10/28/2020   FREET4 1.14 06/13/2022   FREET4 0.43 (L) 03/09/2022   FREET4 4.52 (H) 01/27/2022   FREET4 2.55 (H) 10/28/2020     Thyroid ultrasound on Oct 26, 2021  His right lobe measured 5.8 cm, left lobe measures 5.5 cm.   No discrete nodules are seen within the thyroid gland.   No regional cervical lymphadenopathy.   IMPRESSION: Enlarged, markedly heterogeneous and diffusely hyperemic thyroid without discrete nodule or mass, findings are nonspecific though suggestive of an acute thyroiditis. Clinical correlation is advised.   Thyroid uptake and scan on April 20, 2022 FINDINGS: Homogeneous increased tracer uptake within mildly enlarged thyroid lobes bilaterally.   No focal areas of increased or decreased tracer localization.   4 hour I-123 uptake = 49.6% (normal 5-20%),  24 hour I-123 uptake = 39.9% (normal 10-30%)   IMPRESSION: Homogeneous tracer distribution in mildly enlarged BILATERAL thyroid lobes with increased 4 hour and 24 hour radio iodine uptakes.   Findings consistent with Graves disease.  RAI thyroid ablation May 05, 2022  RADIOPHARMACEUTICALS:  19.7 mCi I-131 sodium iodide orally   IMPRESSION: Per oral administration of I-131 sodium iodide for the  treatment of hyperthyroidism.  Assessment & Plan:   1.  Hyperthyroidism 2.  Goiter  -I reviewed his treatment effect and thyroid function tests.  He did not develop hypothyroidism, will not be initiated on levothyroxine replacement.     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   He is advised to discontinue propranolol.    His recent thyroid ultrasound is consistent with simple  goiter with no discrete nodules.  - he is advised to maintain close follow up with Gildardo Pounds, NP for primary care needs.   I spent 21 minutes in the care of the patient today including review of labs from Thyroid Function, CMP, and other relevant labs ; imaging/biopsy records (current and previous including abstractions from other facilities); face-to-face time discussing  his lab results and symptoms, medications doses, his options of short and long term treatment based on the latest standards of care / guidelines;   and documenting the encounter.  Mathew Wong  participated in the discussions, expressed understanding, and voiced agreement with the above plans.  All questions were answered to his satisfaction. he is encouraged to contact clinic should he have any questions or concerns prior to his return visit.    Follow up plan: Return in about 4 weeks (around 07/18/2022) for F/U with Pre-visit Labs.   Glade Lloyd, MD [facility] [address] [place], [place] [address] Phone: [phone]  Fax: [phone]     06/20/2022, 11:08 AM  This note was partially dictated with voice recognition software. Similar sounding words can be transcribed inadequately or may not  be corrected upon review.

## 2022-07-19 ENCOUNTER — Ambulatory Visit: Payer: Medicaid Other | Admitting: "Endocrinology

## 2022-08-29 ENCOUNTER — Ambulatory Visit: Payer: Medicaid Other | Admitting: "Endocrinology

## 2022-09-24 IMAGING — US US THYROID
1 series · 14 of 25 positions shown · non-contrast
Comparison: None Available.

CLINICAL DATA: Hyperthyroid.

EXAM:
THYROID ULTRASOUND
TECHNIQUE: Ultrasound examination of the thyroid gland and adjacent soft
tissues was performed.

[Series 1: us thyroid · 14 of 46 slices shown]
[im 1/46]
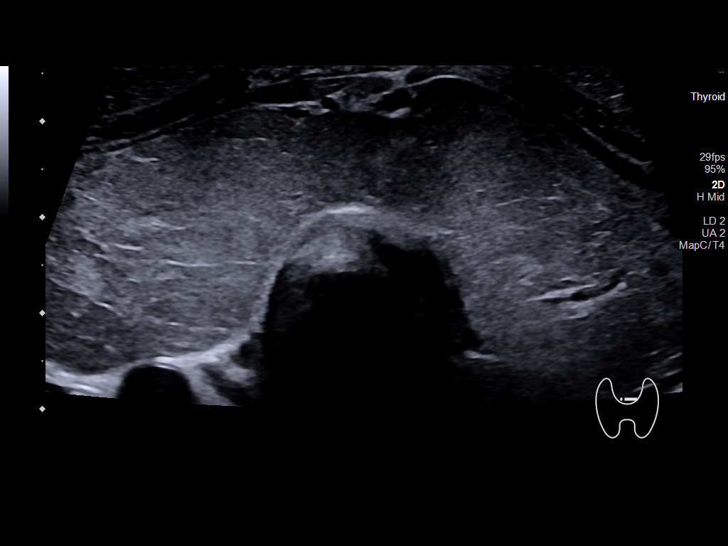
[im 4/46]
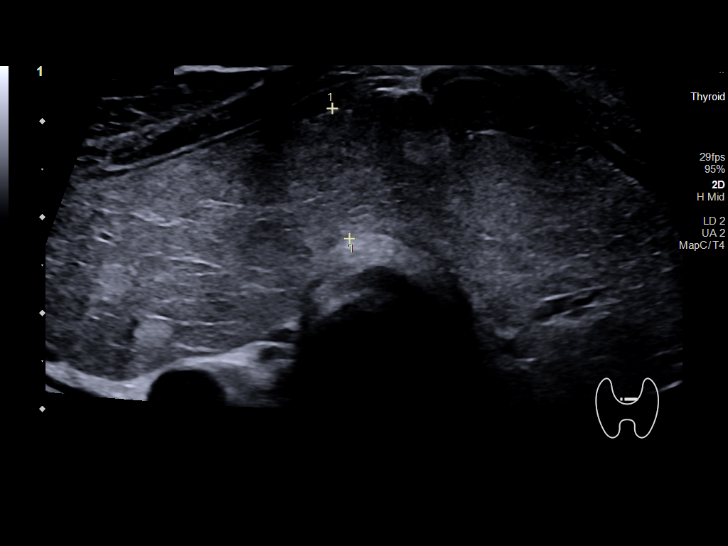
[im 8/46]
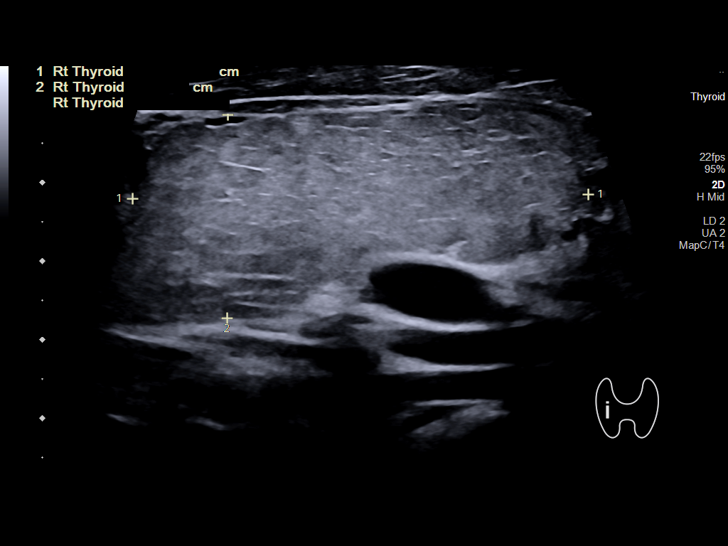
[im 12/46]
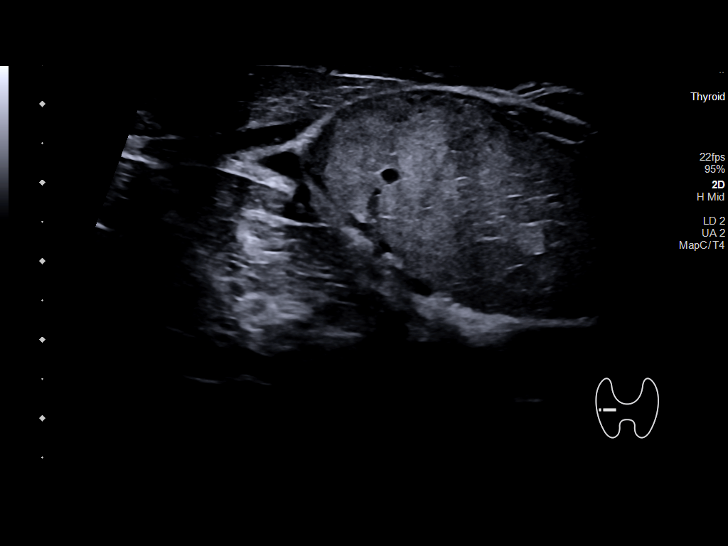
[im 16/46]
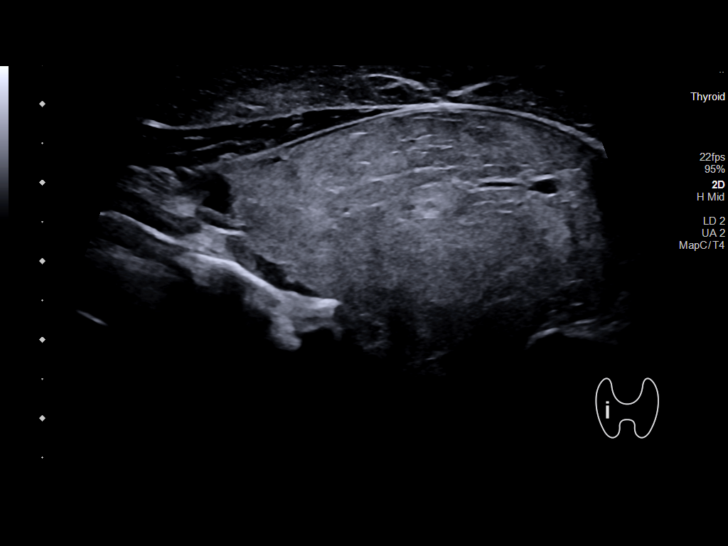
[im 17/46]
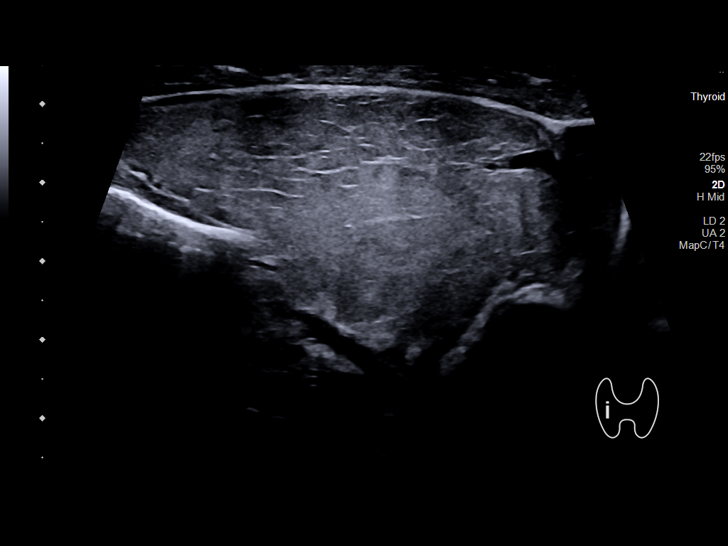
[im 21/46]
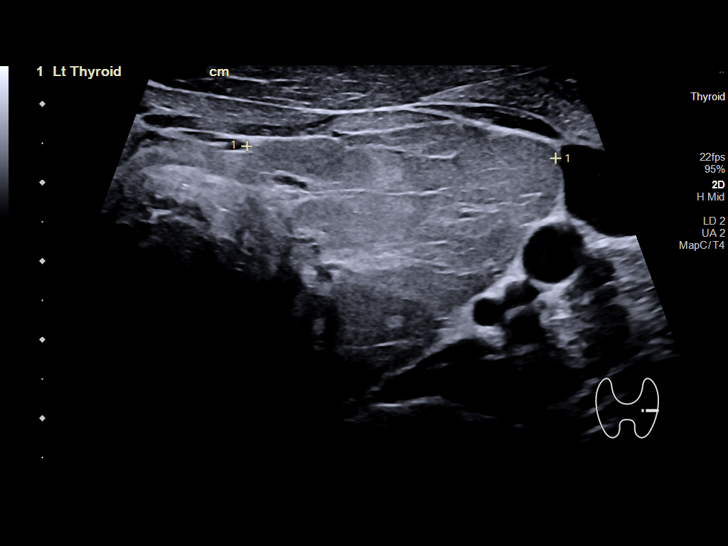
[im 25/46]
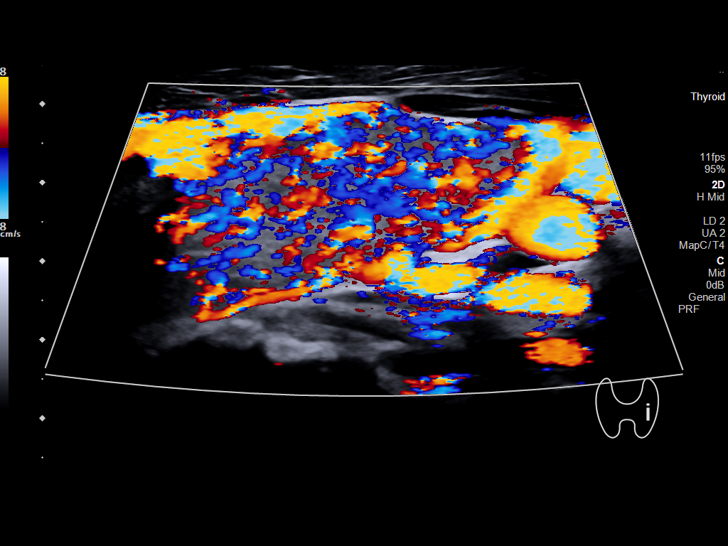
[im 29/46]
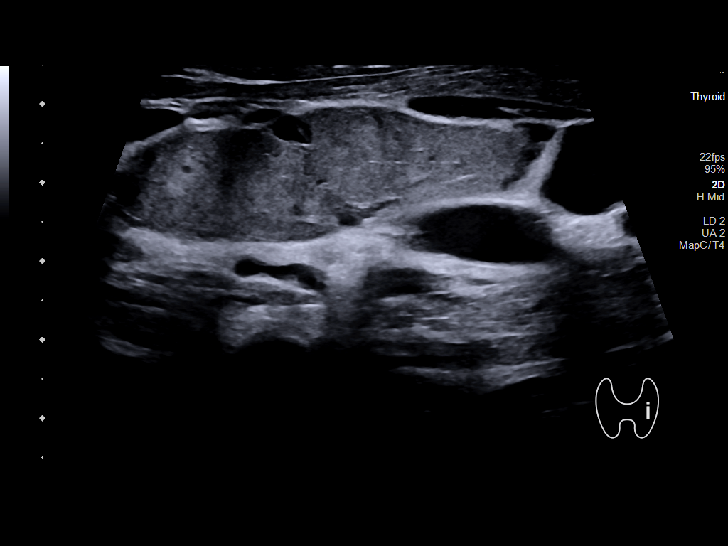
[im 31/46]
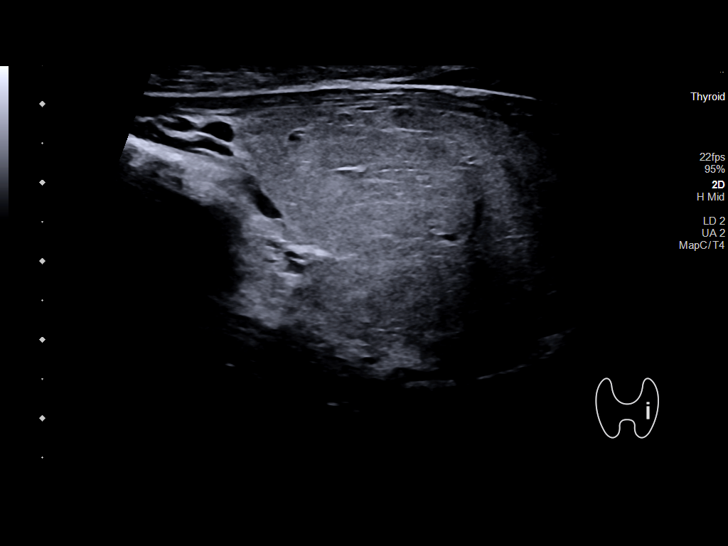
[im 34/46]
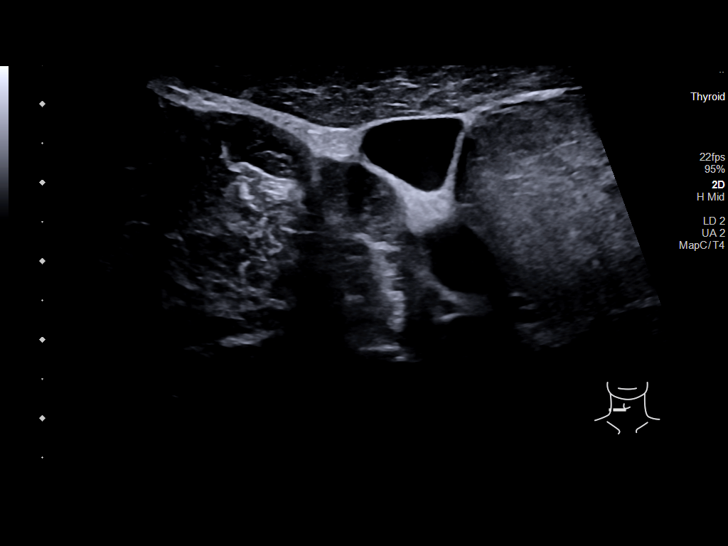
[im 38/46]
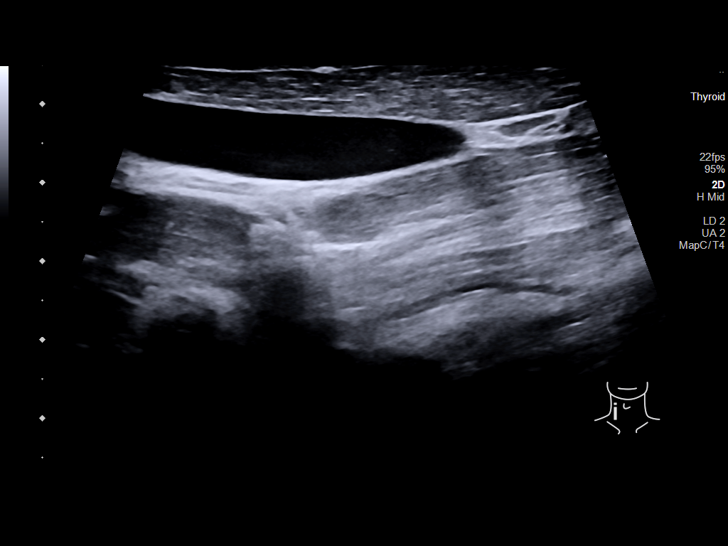
[im 42/46]
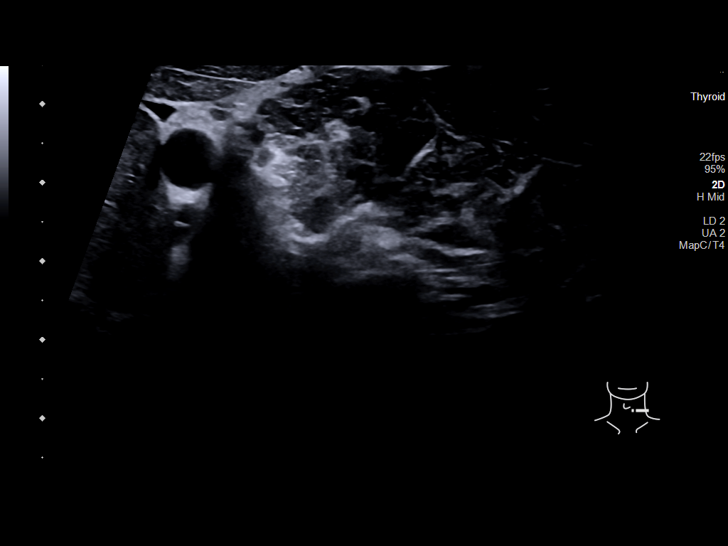
[im 46/46]
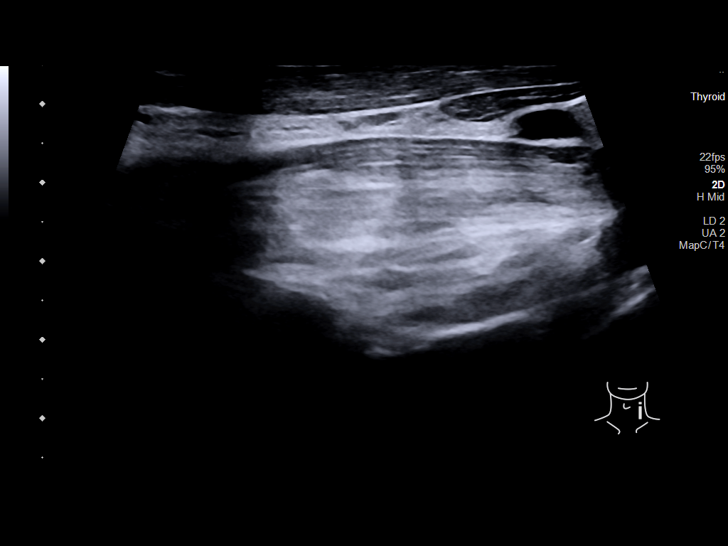

[14 of 25 positions shown; findings below may reference images not displayed]

FINDINGS: Parenchymal Echotexture: Markedly heterogenous diffuse glandular
hyperemia (image 3, 7, 29 and 32)

Isthmus: Enlarged measuring 1.4 cm in diameter

Right lobe: Enlarged measuring 5.8 x 4.2 x 2.6 cm

Left lobe: Enlarged measuring 5.5 x 3.9 x 2.4 cm

_________________________________________________________

Estimated total number of nodules >/= 1 cm: 0

Number of spongiform nodules >/=  2 cm not described below (TR1): 0

Number of mixed cystic and solid nodules >/= 1.5 cm not described
below (TR2): 0

_________________________________________________________

No discrete nodules are seen within the thyroid gland.

No regional cervical lymphadenopathy.
IMPRESSION: Enlarged, markedly heterogeneous and diffusely hyperemic thyroid
without discrete nodule or mass, findings are nonspecific though
suggestive of an acute thyroiditis. Clinical correlation is advised.

## 2022-09-25 ENCOUNTER — Encounter: Payer: Self-pay | Admitting: "Endocrinology

## 2022-09-25 ENCOUNTER — Ambulatory Visit (INDEPENDENT_AMBULATORY_CARE_PROVIDER_SITE_OTHER): Payer: Medicaid Other | Admitting: "Endocrinology

## 2022-09-25 VITALS — BP 124/84 | HR 72 | Ht 68.0 in | Wt 185.0 lb

## 2022-09-25 DIAGNOSIS — E890 Postprocedural hypothyroidism: Secondary | ICD-10-CM | POA: Diagnosis not present

## 2022-09-25 MED ORDER — LEVOTHYROXINE SODIUM 100 MCG PO TABS: 100.0000 ug | ORAL_TABLET | Freq: Every day | ORAL | 1 refills | Status: DC

## 2022-09-25 NOTE — Progress Notes (Signed)
09/25/2022, 12:33 PM  Endocrinology follow-up note   Subjective:    Patient ID: Mathew Wong, male    DOB: 1985-07-28, PCP Claiborne Rigg, NP   Past Medical History:  Diagnosis Date   Gonorrhea    Hyperthyroidism    History reviewed. No pertinent surgical history. Social History   Socioeconomic History   Marital status: Single    Spouse name: Not on file   Number of children: Not on file   Years of education: Not on file   Highest education level: Not on file  Occupational History   Not on file  Tobacco Use   Smoking status: Former    Types: Cigarettes   Smokeless tobacco: Never  Vaping Use   Vaping Use: Every day  Substance and Sexual Activity   Alcohol use: Not Currently   Drug use: Not Currently   Sexual activity: Not on file  Other Topics Concern   Not on file  Social History Narrative   Not on file   Social Determinants of Health   Financial Resource Strain: Not on file  Food Insecurity: Not on file  Transportation Needs: Not on file  Physical Activity: Not on file  Stress: Not on file  Social Connections: Not on file   Family History  Problem Relation Age of Onset   Thyroid disease Father    Diabetes Father    Outpatient Encounter Medications as of 09/25/2022  Medication Sig   levothyroxine (SYNTHROID) 100 MCG tablet Take 1 tablet (100 mcg total) by mouth daily before breakfast.   No facility-administered encounter medications on file as of 09/25/2022.   ALLERGIES: No Known Allergies  VACCINATION STATUS: Immunization History  Administered Date(s) Administered   Moderna Sars-Covid-2 Vaccination 10/02/2019, 10/28/2019    HPI Mathew Wong is 37 y.o. male who presents today with a medical history as above. he is being seen in follow-up after he received radioactive iodine thyroid ablation on May 05, 2022.  He was supposed to be back in February with repeat labs, however failed  to show up.  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  Patient presents with fatigue, progressive weight gain of 10 pounds since last visit.   He is not on thyroid hormone replacement yet.  His previous symptoms of thyrotoxicosis have resolved. He also has goiter with no discrete nodules.  He denies dysphagia, shortness of breath, nor voice change.  He has unidentified thyroid dysfunctions in several family members.  He denies family history of thyroid malignancy.      Review of Systems  Constitutional: no recent weight gain/loss, no fatigue, no subjective hyperthermia, no subjective hypothermia Eyes: no blurry vision, no xerophthalmia ENT: no sore throat, no nodules palpated in throat, no dysphagia/odynophagia, no hoarseness   Objective:       09/25/2022    8:37 AM 06/20/2022   10:29 AM 04/24/2022   11:27 AM  Vitals with BMI  Height 5\' 8"  5\' 8"  5\' 8"   Weight 185 lbs 175 lbs 160 lbs  BMI 28.14 26.61 24.33  Systolic 124 124 161  Diastolic 84 72 78  Pulse 72 68 96    BP 124/84   Pulse 72   Ht 5'  8" (1.727 m)   Wt 185 lb (83.9 kg)   BMI 28.13 kg/m   Wt Readings from Last 3 Encounters:  09/25/22 185 lb (83.9 kg)  06/20/22 175 lb (79.4 kg)  04/24/22 160 lb (72.6 kg)    Physical Exam  Constitutional:  Body mass index is 28.13 kg/m.,  not in acute distress, normal state of mind Eyes: PERRLA, EOMI, no exophthalmos ENT: moist mucous membranes, + gross thyromegaly with bruit, no gross cervical lymphadenopathy   CMP ( most recent) CMP     Component Value Date/Time   NA 142 03/09/2022 1203   K 4.4 03/09/2022 1203   CL 104 03/09/2022 1203   CO2 25 03/09/2022 1203   GLUCOSE 83 03/09/2022 1203   GLUCOSE 112 (H) 10/28/2020 1024   BUN 14 03/09/2022 1203   CREATININE 1.17 03/09/2022 1203   CALCIUM 9.4 03/09/2022 1203   PROT 6.9 03/09/2022 1203   ALBUMIN 4.1 03/09/2022 1203   AST 324 (H) 03/09/2022 1203   ALT 116 (H) 03/09/2022 1203   ALKPHOS 126  (H) 03/09/2022 1203   BILITOT 0.3 03/09/2022 1203   GFRNONAA >60 10/28/2020 1024   GFRAA  12/09/2007 1530    >60        The eGFR has been calculated using the MDRD equation. This calculation has not been validated in all clinical   Lab Results  Component Value Date   TSH 84.600 (H) 09/22/2022   TSH <0.005 (L) 06/13/2022   TSH 0.055 (L) 03/09/2022   TSH 0.011 (L) 01/27/2022   TSH 55.700 (H) 10/19/2021   TSH 1.950 02/11/2021   TSH <0.005 (L) 12/08/2020   TSH <0.010 (L) 10/28/2020   FREET4 WILL FOLLOW 09/22/2022   FREET4 1.14 06/13/2022   FREET4 0.43 (L) 03/09/2022   FREET4 4.52 (H) 01/27/2022   FREET4 2.55 (H) 10/28/2020     Thyroid ultrasound on Oct 26, 2021  His right lobe measured 5.8 cm, left lobe measures 5.5 cm.   No discrete nodules are seen within the thyroid gland.   No regional cervical lymphadenopathy.   IMPRESSION: Enlarged, markedly heterogeneous and diffusely hyperemic thyroid without discrete nodule or mass, findings are nonspecific though suggestive of an acute thyroiditis. Clinical correlation is advised.   Thyroid uptake and scan on April 20, 2022 FINDINGS: Homogeneous increased tracer uptake within mildly enlarged thyroid lobes bilaterally.   No focal areas of increased or decreased tracer localization.   4 hour I-123 uptake = 49.6% (normal 5-20%),  24 hour I-123 uptake = 39.9% (normal 10-30%)   IMPRESSION: Homogeneous tracer distribution in mildly enlarged BILATERAL thyroid lobes with increased 4 hour and 24 hour radio iodine uptakes.   Findings consistent with Graves disease.  RAI thyroid ablation May 05, 2022  RADIOPHARMACEUTICALS:  19.7 mCi I-131 sodium iodide orally   IMPRESSION: Per oral administration of I-131 sodium iodide for the treatment of hyperthyroidism.  Assessment & Plan:   1.  RAI induced hypothyroidism 2.  Goiter  -I reviewed his treatment effect and thyroid function tests.  He has developed RAI  hypothyroidism.  He is ready for thyroid hormone replacement.  I discussed initiated levothyroxine 100 mcg p.o. daily before breakfast.     - We discussed about the correct intake of his thyroid hormone, on empty stomach at fasting, with water, separated by at least 30 minutes from breakfast and other medications,  and separated by more than 4 hours from calcium, iron, multivitamins, acid reflux medications (PPIs). -Patient is made aware of the fact  that thyroid hormone replacement is needed for life, dose to be adjusted by periodic monitoring of thyroid function tests.    His recent thyroid ultrasound is consistent with simple goiter with no discrete nodules.  Clinical exam today shows slight decrease in the overall size of his thyroid.  - he is advised to maintain close follow up with Claiborne Rigg, NP for primary care needs.   I spent  22  minutes in the care of the patient today including review of labs from Thyroid Function, CMP, and other relevant labs ; imaging/biopsy records (current and previous including abstractions from other facilities); face-to-face time discussing  his lab results and symptoms, medications doses, his options of short and long term treatment based on the latest standards of care / guidelines;   and documenting the encounter.  Mathew Wong  participated in the discussions, expressed understanding, and voiced agreement with the above plans.  All questions were answered to his satisfaction. he is encouraged to contact clinic should he have any questions or concerns prior to his return visit.   Follow up plan: Return in about 3 months (around 12/25/2022) for Fasting Labs  in AM B4 8.   Marquis Lunch, MD [facility] [address] [place], [place] [address] Phone: [phone]  Fax: [phone]     09/25/2022, 12:33 PM  This note was partially dictated with voice recognition software. Similar sounding  words can be transcribed inadequately or may not  be corrected upon review.

## 2022-09-30 LAB — T4, FREE: Free T4: 0.1 ng/dL — ABNORMAL LOW (ref 0.82–1.77)

## 2022-09-30 LAB — TSH: TSH: 84.6 u[IU]/mL — ABNORMAL HIGH (ref 0.450–4.500)

## 2022-09-30 LAB — T3, FREE: T3, Free: <0.3 pg/mL — ABNORMAL LOW (ref 2.0–4.4)

## 2022-10-02 ENCOUNTER — Telehealth: Payer: Self-pay | Admitting: "Endocrinology

## 2022-10-02 NOTE — Telephone Encounter (Signed)
Pt did his labs, his appt is not until August. Please Advise

## 2022-10-03 NOTE — Telephone Encounter (Signed)
Last labs seen was performed on 4/19 for follow up visit on 4/22. Pt has orders for future lab work prior to his visit in August.

## 2023-01-08 ENCOUNTER — Ambulatory Visit: Payer: Medicaid Other | Admitting: "Endocrinology

## 2023-03-14 ENCOUNTER — Ambulatory Visit: Payer: Medicaid Other | Admitting: "Endocrinology

## 2023-03-23 ENCOUNTER — Other Ambulatory Visit: Payer: Self-pay | Admitting: "Endocrinology

## 2023-06-21 ENCOUNTER — Other Ambulatory Visit: Payer: Self-pay | Admitting: "Endocrinology

## 2023-07-26 ENCOUNTER — Telehealth: Payer: Self-pay | Admitting: "Endocrinology

## 2023-07-26 DIAGNOSIS — E890 Postprocedural hypothyroidism: Secondary | ICD-10-CM

## 2023-07-26 NOTE — Telephone Encounter (Signed)
 Pt had labs done looks like somewhere else in Dec, can you use these or do you need more done?

## 2023-07-27 NOTE — Telephone Encounter (Signed)
Lab orders updated and sent to [place].

## 2023-07-27 NOTE — Telephone Encounter (Signed)
 Called to let pt know he needed labs before visit on 08/06/23.  Pt did not answer.

## 2023-08-06 ENCOUNTER — Ambulatory Visit: Payer: MEDICAID | Admitting: "Endocrinology

## 2023-10-03 ENCOUNTER — Telehealth: Payer: Self-pay | Admitting: "Endocrinology

## 2023-10-03 ENCOUNTER — Other Ambulatory Visit: Payer: Self-pay

## 2023-10-03 NOTE — Telephone Encounter (Signed)
 I will confirm with Dr.Nida once he is back next week.

## 2023-10-03 NOTE — Telephone Encounter (Signed)
 Pt was seen at the hospital and had labs done.  Will you make sure those are okay to use for his appt on 11/02/23.

## 2023-10-12 NOTE — Telephone Encounter (Signed)
 Please Advise

## 2023-10-23 ENCOUNTER — Encounter: Payer: Self-pay | Admitting: "Endocrinology

## 2023-10-23 MED ORDER — LEVOTHYROXINE SODIUM 100 MCG PO TABS: 100.0000 ug | ORAL_TABLET | Freq: Every day | ORAL | 0 refills | Status: AC

## 2023-10-23 NOTE — Telephone Encounter (Signed)
 This patient has not been seen since 09/25/22, are you ok with me sending refills on his medication?

## 2023-11-02 ENCOUNTER — Ambulatory Visit: Payer: MEDICAID | Admitting: "Endocrinology
